# Patient Record
Sex: Female | Born: 1970 | Race: White | Hispanic: Yes | Marital: Married | Smoking: Never smoker
Health system: Southern US, Community
[De-identification: ages and names within clinical notes are randomized; demographics above are authoritative.]

## PROBLEM LIST (undated history)

## (undated) DIAGNOSIS — E785 Hyperlipidemia, unspecified: Secondary | ICD-10-CM

## (undated) DIAGNOSIS — N8003 Adenomyosis of the uterus: Secondary | ICD-10-CM

## (undated) DIAGNOSIS — I82409 Acute embolism and thrombosis of unspecified deep veins of unspecified lower extremity: Secondary | ICD-10-CM

## (undated) DIAGNOSIS — G5600 Carpal tunnel syndrome, unspecified upper limb: Secondary | ICD-10-CM

## (undated) DIAGNOSIS — I493 Ventricular premature depolarization: Secondary | ICD-10-CM

## (undated) DIAGNOSIS — L2089 Other atopic dermatitis: Secondary | ICD-10-CM

## (undated) DIAGNOSIS — M792 Neuralgia and neuritis, unspecified: Secondary | ICD-10-CM

## (undated) DIAGNOSIS — M1990 Unspecified osteoarthritis, unspecified site: Secondary | ICD-10-CM

## (undated) DIAGNOSIS — Z1509 Genetic susceptibility to other malignant neoplasm: Secondary | ICD-10-CM

## (undated) DIAGNOSIS — K219 Gastro-esophageal reflux disease without esophagitis: Secondary | ICD-10-CM

## (undated) DIAGNOSIS — I890 Lymphedema, not elsewhere classified: Secondary | ICD-10-CM

## (undated) DIAGNOSIS — Z1501 Genetic susceptibility to malignant neoplasm of breast: Secondary | ICD-10-CM

## (undated) DIAGNOSIS — N800 Endometriosis of uterus: Secondary | ICD-10-CM

## (undated) DIAGNOSIS — T7840XA Allergy, unspecified, initial encounter: Secondary | ICD-10-CM

## (undated) DIAGNOSIS — Z9889 Other specified postprocedural states: Secondary | ICD-10-CM

## (undated) DIAGNOSIS — N809 Endometriosis, unspecified: Secondary | ICD-10-CM

## (undated) HISTORY — DX: Genetic susceptibility to malignant neoplasm of breast: Z15.09

## (undated) HISTORY — DX: Unspecified osteoarthritis, unspecified site: M19.90

## (undated) HISTORY — DX: Endometriosis, unspecified: N80.9

## (undated) HISTORY — DX: Neuralgia and neuritis, unspecified: M79.2

## (undated) HISTORY — DX: Gastro-esophageal reflux disease without esophagitis: K21.9

## (undated) HISTORY — DX: Other specified postprocedural states: Z98.890

## (undated) HISTORY — DX: Lymphedema, not elsewhere classified: I89.0

## (undated) HISTORY — DX: Other atopic dermatitis: L20.89

## (undated) HISTORY — DX: Acute embolism and thrombosis of unspecified deep veins of unspecified lower extremity: I82.409

## (undated) HISTORY — DX: Carpal tunnel syndrome, unspecified upper limb: G56.00

## (undated) HISTORY — DX: Endometriosis of uterus: N80.0

## (undated) HISTORY — DX: Hyperlipidemia, unspecified: E78.5

## (undated) HISTORY — DX: Adenomyosis of the uterus: N80.03

## (undated) HISTORY — DX: Genetic susceptibility to malignant neoplasm of breast: Z15.01

## (undated) HISTORY — DX: Allergy, unspecified, initial encounter: T78.40XA

## (undated) HISTORY — DX: Ventricular premature depolarization: I49.3

---

## 2000-11-06 ENCOUNTER — Inpatient Hospital Stay (HOSPITAL_COMMUNITY): Admission: AD | Admit: 2000-11-06 | Discharge: 2000-11-06 | Payer: Self-pay | Admitting: Obstetrics

## 2000-12-05 ENCOUNTER — Encounter: Admission: RE | Admit: 2000-12-05 | Discharge: 2000-12-05 | Payer: Self-pay | Admitting: Family Medicine

## 2000-12-25 ENCOUNTER — Encounter: Admission: RE | Admit: 2000-12-25 | Discharge: 2000-12-25 | Payer: Self-pay | Admitting: Family Medicine

## 2000-12-30 ENCOUNTER — Encounter: Admission: RE | Admit: 2000-12-30 | Discharge: 2000-12-30 | Payer: Self-pay | Admitting: Family Medicine

## 2001-01-01 ENCOUNTER — Ambulatory Visit (HOSPITAL_COMMUNITY): Admission: RE | Admit: 2001-01-01 | Discharge: 2001-01-01 | Payer: Self-pay

## 2001-01-22 ENCOUNTER — Encounter: Admission: RE | Admit: 2001-01-22 | Discharge: 2001-01-22 | Payer: Self-pay | Admitting: Family Medicine

## 2001-02-13 ENCOUNTER — Encounter: Admission: RE | Admit: 2001-02-13 | Discharge: 2001-02-13 | Payer: Self-pay | Admitting: Family Medicine

## 2001-02-17 ENCOUNTER — Encounter: Admission: RE | Admit: 2001-02-17 | Discharge: 2001-02-17 | Payer: Self-pay | Admitting: Family Medicine

## 2001-02-25 ENCOUNTER — Encounter: Admission: RE | Admit: 2001-02-25 | Discharge: 2001-02-25 | Payer: Self-pay | Admitting: Family Medicine

## 2001-03-05 ENCOUNTER — Encounter: Admission: RE | Admit: 2001-03-05 | Discharge: 2001-03-05 | Payer: Self-pay | Admitting: Family Medicine

## 2001-03-12 ENCOUNTER — Encounter: Admission: RE | Admit: 2001-03-12 | Discharge: 2001-03-12 | Payer: Self-pay | Admitting: Family Medicine

## 2001-03-17 ENCOUNTER — Encounter (HOSPITAL_COMMUNITY): Admission: RE | Admit: 2001-03-17 | Discharge: 2001-03-17 | Payer: Self-pay | Admitting: Obstetrics & Gynecology

## 2001-03-18 ENCOUNTER — Encounter: Admission: RE | Admit: 2001-03-18 | Discharge: 2001-03-18 | Payer: Self-pay | Admitting: Family Medicine

## 2001-03-19 ENCOUNTER — Encounter (INDEPENDENT_AMBULATORY_CARE_PROVIDER_SITE_OTHER): Payer: Self-pay

## 2001-03-19 ENCOUNTER — Inpatient Hospital Stay (HOSPITAL_COMMUNITY): Admission: AD | Admit: 2001-03-19 | Discharge: 2001-03-23 | Payer: Self-pay | Admitting: *Deleted

## 2001-03-24 ENCOUNTER — Encounter: Admission: RE | Admit: 2001-03-24 | Discharge: 2001-04-23 | Payer: Self-pay | Admitting: *Deleted

## 2001-03-28 ENCOUNTER — Encounter: Admission: RE | Admit: 2001-03-28 | Discharge: 2001-03-28 | Payer: Self-pay | Admitting: Sports Medicine

## 2001-03-29 ENCOUNTER — Inpatient Hospital Stay (HOSPITAL_COMMUNITY): Admission: AD | Admit: 2001-03-29 | Discharge: 2001-03-29 | Payer: Self-pay | Admitting: *Deleted

## 2001-04-01 ENCOUNTER — Encounter: Admission: RE | Admit: 2001-04-01 | Discharge: 2001-04-01 | Payer: Self-pay | Admitting: Family Medicine

## 2001-05-01 ENCOUNTER — Encounter: Admission: RE | Admit: 2001-05-01 | Discharge: 2001-05-01 | Payer: Self-pay | Admitting: Family Medicine

## 2001-08-04 ENCOUNTER — Encounter: Admission: RE | Admit: 2001-08-04 | Discharge: 2001-08-04 | Payer: Self-pay | Admitting: Family Medicine

## 2001-11-21 ENCOUNTER — Encounter: Admission: RE | Admit: 2001-11-21 | Discharge: 2001-11-21 | Payer: Self-pay | Admitting: Family Medicine

## 2001-11-27 ENCOUNTER — Encounter: Admission: RE | Admit: 2001-11-27 | Discharge: 2001-11-27 | Payer: Self-pay | Admitting: Family Medicine

## 2002-06-23 ENCOUNTER — Encounter: Admission: RE | Admit: 2002-06-23 | Discharge: 2002-06-23 | Payer: Self-pay | Admitting: Family Medicine

## 2002-06-29 ENCOUNTER — Encounter: Payer: Self-pay | Admitting: Family Medicine

## 2002-06-29 ENCOUNTER — Encounter: Admission: RE | Admit: 2002-06-29 | Discharge: 2002-06-29 | Payer: Self-pay | Admitting: Family Medicine

## 2002-07-07 ENCOUNTER — Encounter: Admission: RE | Admit: 2002-07-07 | Discharge: 2002-07-07 | Payer: Self-pay | Admitting: Family Medicine

## 2002-11-17 ENCOUNTER — Encounter: Admission: RE | Admit: 2002-11-17 | Discharge: 2002-11-17 | Payer: Self-pay | Admitting: Sports Medicine

## 2003-05-18 ENCOUNTER — Encounter: Admission: RE | Admit: 2003-05-18 | Discharge: 2003-05-18 | Payer: Self-pay | Admitting: Family Medicine

## 2003-08-09 ENCOUNTER — Encounter: Admission: RE | Admit: 2003-08-09 | Discharge: 2003-08-09 | Payer: Self-pay | Admitting: Family Medicine

## 2003-08-17 ENCOUNTER — Encounter: Admission: RE | Admit: 2003-08-17 | Discharge: 2003-08-17 | Payer: Self-pay | Admitting: Sports Medicine

## 2003-10-14 ENCOUNTER — Encounter: Admission: RE | Admit: 2003-10-14 | Discharge: 2003-10-14 | Payer: Self-pay | Admitting: Sports Medicine

## 2003-10-18 ENCOUNTER — Encounter: Admission: RE | Admit: 2003-10-18 | Discharge: 2003-10-18 | Payer: Self-pay | Admitting: Family Medicine

## 2003-10-21 ENCOUNTER — Encounter: Admission: RE | Admit: 2003-10-21 | Discharge: 2003-10-21 | Payer: Self-pay | Admitting: Sports Medicine

## 2003-10-26 ENCOUNTER — Encounter: Admission: RE | Admit: 2003-10-26 | Discharge: 2003-10-26 | Payer: Self-pay | Admitting: Family Medicine

## 2004-04-17 ENCOUNTER — Ambulatory Visit: Payer: Self-pay | Admitting: Family Medicine

## 2004-05-16 ENCOUNTER — Ambulatory Visit: Payer: Self-pay | Admitting: Sports Medicine

## 2004-07-30 ENCOUNTER — Encounter (INDEPENDENT_AMBULATORY_CARE_PROVIDER_SITE_OTHER): Payer: Self-pay | Admitting: *Deleted

## 2004-08-11 ENCOUNTER — Ambulatory Visit: Payer: Self-pay | Admitting: Sports Medicine

## 2004-08-16 ENCOUNTER — Ambulatory Visit (HOSPITAL_COMMUNITY): Admission: RE | Admit: 2004-08-16 | Discharge: 2004-08-16 | Payer: Self-pay | Admitting: Family Medicine

## 2004-08-24 ENCOUNTER — Encounter: Admission: RE | Admit: 2004-08-24 | Discharge: 2004-08-24 | Payer: Self-pay | Admitting: Family Medicine

## 2004-09-12 ENCOUNTER — Ambulatory Visit: Payer: Self-pay | Admitting: Family Medicine

## 2004-10-19 ENCOUNTER — Ambulatory Visit: Payer: Self-pay | Admitting: Sports Medicine

## 2004-11-30 ENCOUNTER — Ambulatory Visit: Payer: Self-pay | Admitting: Family Medicine

## 2005-06-15 ENCOUNTER — Ambulatory Visit: Payer: Self-pay | Admitting: Family Medicine

## 2005-07-04 ENCOUNTER — Ambulatory Visit: Payer: Self-pay | Admitting: Family Medicine

## 2005-08-28 ENCOUNTER — Encounter: Admission: RE | Admit: 2005-08-28 | Discharge: 2005-08-28 | Payer: Self-pay | Admitting: Sports Medicine

## 2005-09-19 ENCOUNTER — Encounter: Admission: RE | Admit: 2005-09-19 | Discharge: 2005-09-19 | Payer: Self-pay | Admitting: Sports Medicine

## 2005-10-18 ENCOUNTER — Ambulatory Visit: Payer: Self-pay | Admitting: Family Medicine

## 2005-10-18 ENCOUNTER — Other Ambulatory Visit: Admission: RE | Admit: 2005-10-18 | Discharge: 2005-10-18 | Payer: Self-pay | Admitting: Family Medicine

## 2005-11-02 ENCOUNTER — Encounter: Admission: RE | Admit: 2005-11-02 | Discharge: 2005-11-02 | Payer: Self-pay | Admitting: *Deleted

## 2006-02-12 ENCOUNTER — Encounter (INDEPENDENT_AMBULATORY_CARE_PROVIDER_SITE_OTHER): Payer: Self-pay | Admitting: *Deleted

## 2006-02-12 ENCOUNTER — Inpatient Hospital Stay (HOSPITAL_COMMUNITY): Admission: RE | Admit: 2006-02-12 | Discharge: 2006-02-14 | Payer: Self-pay | Admitting: Obstetrics & Gynecology

## 2006-02-12 HISTORY — PX: ABDOMINAL HYSTERECTOMY: SHX81

## 2006-03-29 ENCOUNTER — Encounter: Admission: RE | Admit: 2006-03-29 | Discharge: 2006-03-29 | Payer: Self-pay | Admitting: Obstetrics & Gynecology

## 2006-04-12 ENCOUNTER — Ambulatory Visit: Payer: Self-pay | Admitting: Family Medicine

## 2006-06-03 ENCOUNTER — Ambulatory Visit: Payer: Self-pay | Admitting: Sports Medicine

## 2006-08-30 ENCOUNTER — Ambulatory Visit: Payer: Self-pay | Admitting: *Deleted

## 2006-09-02 ENCOUNTER — Encounter: Admission: RE | Admit: 2006-09-02 | Discharge: 2006-09-02 | Payer: Self-pay | Admitting: Obstetrics & Gynecology

## 2006-09-02 ENCOUNTER — Ambulatory Visit: Payer: Self-pay | Admitting: Family Medicine

## 2006-09-26 DIAGNOSIS — J45909 Unspecified asthma, uncomplicated: Secondary | ICD-10-CM | POA: Insufficient documentation

## 2006-09-26 DIAGNOSIS — L2089 Other atopic dermatitis: Secondary | ICD-10-CM

## 2006-09-26 HISTORY — DX: Other atopic dermatitis: L20.89

## 2006-09-27 ENCOUNTER — Encounter (INDEPENDENT_AMBULATORY_CARE_PROVIDER_SITE_OTHER): Payer: Self-pay | Admitting: *Deleted

## 2006-10-02 ENCOUNTER — Telehealth: Payer: Self-pay | Admitting: *Deleted

## 2006-10-03 ENCOUNTER — Ambulatory Visit: Payer: Self-pay

## 2007-04-14 ENCOUNTER — Encounter: Admission: RE | Admit: 2007-04-14 | Discharge: 2007-04-14 | Payer: Self-pay | Admitting: Obstetrics & Gynecology

## 2007-05-07 ENCOUNTER — Ambulatory Visit: Payer: Self-pay | Admitting: Family Medicine

## 2007-05-16 ENCOUNTER — Telehealth (INDEPENDENT_AMBULATORY_CARE_PROVIDER_SITE_OTHER): Payer: Self-pay | Admitting: *Deleted

## 2007-05-16 ENCOUNTER — Ambulatory Visit: Payer: Self-pay | Admitting: Internal Medicine

## 2007-05-20 ENCOUNTER — Telehealth (INDEPENDENT_AMBULATORY_CARE_PROVIDER_SITE_OTHER): Payer: Self-pay | Admitting: *Deleted

## 2007-05-20 ENCOUNTER — Ambulatory Visit: Payer: Self-pay | Admitting: Family Medicine

## 2007-05-23 ENCOUNTER — Ambulatory Visit: Payer: Self-pay | Admitting: Family Medicine

## 2007-06-09 ENCOUNTER — Ambulatory Visit: Payer: Self-pay | Admitting: Family Medicine

## 2007-07-18 HISTORY — PX: TYMPANOMASTOIDECTOMY: SHX34

## 2007-08-25 ENCOUNTER — Other Ambulatory Visit: Admission: RE | Admit: 2007-08-25 | Discharge: 2007-08-25 | Payer: Self-pay | Admitting: Obstetrics & Gynecology

## 2007-09-09 ENCOUNTER — Encounter: Admission: RE | Admit: 2007-09-09 | Discharge: 2007-09-09 | Payer: Self-pay | Admitting: Obstetrics & Gynecology

## 2008-03-25 ENCOUNTER — Ambulatory Visit: Payer: Self-pay | Admitting: Family Medicine

## 2008-03-25 ENCOUNTER — Telehealth: Payer: Self-pay | Admitting: *Deleted

## 2008-04-01 ENCOUNTER — Ambulatory Visit (HOSPITAL_COMMUNITY): Admission: RE | Admit: 2008-04-01 | Discharge: 2008-04-01 | Payer: Self-pay | Admitting: Family Medicine

## 2008-04-01 ENCOUNTER — Encounter: Payer: Self-pay | Admitting: Family Medicine

## 2008-04-01 ENCOUNTER — Ambulatory Visit: Payer: Self-pay | Admitting: Family Medicine

## 2008-04-01 LAB — CONVERTED CEMR LAB
Alkaline Phosphatase: 66 units/L (ref 39–117)
BUN: 10 mg/dL (ref 6–23)
Glucose, Bld: 94 mg/dL (ref 70–99)
Sodium: 140 meq/L (ref 135–145)
Total Bilirubin: 0.3 mg/dL (ref 0.3–1.2)

## 2008-04-02 ENCOUNTER — Encounter: Payer: Self-pay | Admitting: Family Medicine

## 2008-04-02 ENCOUNTER — Telehealth (INDEPENDENT_AMBULATORY_CARE_PROVIDER_SITE_OTHER): Payer: Self-pay | Admitting: *Deleted

## 2008-04-06 ENCOUNTER — Encounter: Payer: Self-pay | Admitting: Family Medicine

## 2008-04-06 ENCOUNTER — Ambulatory Visit: Payer: Self-pay | Admitting: Family Medicine

## 2008-04-07 ENCOUNTER — Encounter: Payer: Self-pay | Admitting: Family Medicine

## 2008-04-12 ENCOUNTER — Encounter: Payer: Self-pay | Admitting: Family Medicine

## 2008-04-20 ENCOUNTER — Ambulatory Visit: Payer: Self-pay | Admitting: Family Medicine

## 2008-05-20 ENCOUNTER — Ambulatory Visit: Payer: Self-pay | Admitting: Family Medicine

## 2008-05-20 ENCOUNTER — Encounter: Payer: Self-pay | Admitting: Family Medicine

## 2008-05-21 ENCOUNTER — Encounter: Payer: Self-pay | Admitting: Family Medicine

## 2008-05-25 LAB — CONVERTED CEMR LAB
Cholesterol: 217 mg/dL — ABNORMAL HIGH (ref 0–200)
HCT: 40.2 % (ref 36.0–46.0)
HDL: 54 mg/dL (ref >39)
Hemoglobin: 13.4 g/dL (ref 12.0–15.0)
LDL Cholesterol: 138 mg/dL — ABNORMAL HIGH (ref 0–99)
MCHC: 33.3 g/dL (ref 30.0–36.0)
MCV: 87.4 fL (ref 78.0–100.0)
Platelets: 317 10*3/uL (ref 150–400)
RBC: 4.6 M/uL (ref 3.87–5.11)
RDW: 13.1 % (ref 11.5–15.5)
TSH: 2.272 microintl units/mL (ref 0.350–4.50)
Total CHOL/HDL Ratio: 4
Triglycerides: 127 mg/dL (ref <150)
VLDL: 25 mg/dL (ref 0–40)
WBC: 7.2 10*3/uL (ref 4.0–10.5)

## 2008-09-20 ENCOUNTER — Encounter: Admission: RE | Admit: 2008-09-20 | Discharge: 2008-09-20 | Payer: Self-pay | Admitting: Obstetrics & Gynecology

## 2009-06-10 ENCOUNTER — Ambulatory Visit: Payer: Self-pay | Admitting: Family Medicine

## 2009-07-30 HISTORY — PX: OOPHORECTOMY: SHX86

## 2009-10-10 ENCOUNTER — Encounter: Admission: RE | Admit: 2009-10-10 | Discharge: 2009-10-10 | Payer: Self-pay | Admitting: Obstetrics & Gynecology

## 2009-10-24 ENCOUNTER — Encounter: Admission: RE | Admit: 2009-10-24 | Discharge: 2009-10-24 | Payer: Self-pay | Admitting: Obstetrics & Gynecology

## 2009-10-26 ENCOUNTER — Encounter: Admission: RE | Admit: 2009-10-26 | Discharge: 2009-10-26 | Payer: Self-pay | Admitting: Obstetrics & Gynecology

## 2009-10-28 DIAGNOSIS — Z9889 Other specified postprocedural states: Secondary | ICD-10-CM

## 2009-10-28 HISTORY — DX: Other specified postprocedural states: Z98.890

## 2009-11-02 ENCOUNTER — Encounter: Admission: RE | Admit: 2009-11-02 | Discharge: 2009-11-02 | Payer: Self-pay | Admitting: Obstetrics & Gynecology

## 2009-11-16 ENCOUNTER — Ambulatory Visit: Payer: Self-pay | Admitting: Oncology

## 2010-04-20 ENCOUNTER — Ambulatory Visit: Payer: Self-pay | Admitting: Oncology

## 2010-05-14 ENCOUNTER — Encounter: Payer: Self-pay | Admitting: Family Medicine

## 2010-05-18 ENCOUNTER — Ambulatory Visit: Payer: Self-pay | Admitting: Family Medicine

## 2010-06-08 ENCOUNTER — Encounter: Admission: RE | Admit: 2010-06-08 | Discharge: 2010-06-08 | Payer: Self-pay | Admitting: Obstetrics & Gynecology

## 2010-06-29 HISTORY — PX: LAPAROSCOPY: SHX197

## 2010-07-11 ENCOUNTER — Telehealth: Payer: Self-pay | Admitting: Family Medicine

## 2010-07-25 ENCOUNTER — Ambulatory Visit (HOSPITAL_COMMUNITY)
Admission: RE | Admit: 2010-07-25 | Discharge: 2010-07-25 | Payer: Self-pay | Source: Home / Self Care | Attending: Obstetrics & Gynecology | Admitting: Obstetrics & Gynecology

## 2010-07-30 HISTORY — PX: MASTECTOMY: SHX3

## 2010-08-20 ENCOUNTER — Encounter: Payer: Self-pay | Admitting: Obstetrics & Gynecology

## 2010-08-29 NOTE — Consult Note (Signed)
[facility]   Imported By: Bradly Bienenstock 05/28/2008 16:59:47  _____________________________________________________________________  External Attachment:    Type:   Image     Comment:   External Document  Appended Document: Orders Update    Clinical Lists Changes  Medications: Added new medication of TOPAMAX 25 MG TABS (TOPIRAMATE) take one by mouth each night for migraine prophylaxis - Signed Rx of TOPAMAX 25 MG TABS (TOPIRAMATE) take one by mouth each night for migraine prophylaxis;  #90 x 0;  Signed;  Entered by: Helane Rima MD;  Authorized by: Helane Rima MD;  Method used: Historical    Prescriptions: TOPAMAX 25 MG TABS (TOPIRAMATE) take one by mouth each night for migraine prophylaxis  #90 x 0   Entered and Authorized by:   Helane Rima MD   Signed by:   Helane Rima MD on 05/30/2008   Method used:   Historical   RxID:   [id number]

## 2010-08-29 NOTE — Assessment & Plan Note (Signed)
Summary: flu shot/wallace/bmc  Nurse Visit   Vital Signs:  Patient profile:   40 year old female Temp:     98.4 degrees F  Vitals Entered By: Theresia Lo RN (May 18, 2010 9:30 AM)  Allergies: 1)  ! Codeine  Immunizations Administered:  Influenza Vaccine # 1:    Vaccine Type: Fluvax 3+    Site: right deltoid    Mfr: GlaxoSmithKline    Dose: 0.5 ml    Route: IM    Given by: Theresia Lo RN    Exp. Date: 01/24/2011    Lot #: [id number]    VIS given: 02/21/10 version given May 18, 2010.  Flu Vaccine Consent Questions:    Do you have a history of severe allergic reactions to this vaccine? no    Any prior history of allergic reactions to egg and/or gelatin? no    Do you have a sensitivity to the preservative Thimersol? no    Do you have a past history of Guillan-Barre Syndrome? no    Do you currently have an acute febrile illness? no    Have you ever had a severe reaction to latex? no    Vaccine information given and explained to patient? yes    Are you currently pregnant? no  Orders Added: 1)  Flu Vaccine 39yrs + [90658] 2)  Admin 1st Vaccine [09811]

## 2010-08-29 NOTE — Assessment & Plan Note (Signed)
Summary: FU ASTHMA/KH   Vital Signs:  Patient Profile:   40 Years Old Female Weight:      142 pounds Pulse rate:   82 / minute BP sitting:   121 / 72  Vitals Entered By: Lillia Pauls CMA (April 20, 2008 2:18 PM)                 PCP:  Angeline Slim MD  Chief Complaint:  FU ASTHMA AND MED RF.  History of Present Illness: Sylvia Hernandez is a 40 yo female that has had a thorough work-up for a history of atypical chest pain of unknown etiology. She has had normal blood work, normal BNP, negative CXR, normal ECHO, normal EKG. She states that her pain and cough associated with the chest pain have improved since her last visit. At that time, she was given an Rx for Omeprazole by Dr. Corky Downs for suspected dyspepsia with reflux. She was also thought to have bronchitis, which has resolved completely.   She recently underwent a treadmill test:  Results of Graded Exercise Summary from Orpah Cobb, MD, reviewed. 04/12/2008 Max Workload:  8.4 mets 7:00 total exercise time (terminated for max HR attained) Max HR:  174 (94% of max predicted 184) Max BP:  140/78 Some chest pain with breathing only.  No dysrhythmia. Delayed ST depression in infero-lateral leads (1 mm). EKG and chest pain equivocal.  She stated that she "had a cold" at the time of the treadmill test and that her cardiologist wants to repeat the test October 14th. He advised her to take a daily ASA until then.   Jackie does still complain of a feeling of chest pressure, dread, and palpitations each night when she lies down. She states that she is not anxious and does not have increased stress at home.  Asthma History:  Asthma Severity Index Determination:      Asthma severity index questions reveal daytime symptoms fewer than 3 times per week and fewer than 3 nighttime symptoms per month.  Goals of Therapy:      The patient notes minimal or no chronic symptoms day or night, minimal or no exacerbations, no limitations on  activity, no school/parent's missing work, minimal use of short-acting inhaled B2 agonists, and minimal or no adverse effects from medications.    Immunizations:      Last Flu shot: Fluvax 3+ (05/07/2007)  Dyspepsia History:      There is a prior history of GERD.  The patient does not have a prior history of documented ulcer disease.  The dominant symptom is heartburn or acid reflux.  No previous upper endoscopy has been done.       Mammogram Sep 09, 2007 normal     Updated Prior Medication List: FLONASE 50 MCG/ACT  SUSP (FLUTICASONE PROPIONATE) Two sprays in each nostril daily OMEPRAZOLE 40 MG CPDR (OMEPRAZOLE) 1 tab by mouth daily ASPIRIN 81 MG  TBEC (ASPIRIN) one by mouth every day  Current Allergies: ! 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       Migraines: see Neurologist, Dr Sharene Skeans       Asthma since childhood, but requires only 1 use of albuterol yearly  Past Surgical History:    Reviewed history from 09/26/2006 and no  changes required:       Breast MRI Normal - 11/09/2005, C Section--failure to progress - 03/30/2001, Pelvic U/S-nl - 08/24/2004   Family History:    Reviewed history from 09/26/2006 and no changes required:       2 first cousins with Breast Ca., Brother has DM and hypothyroidism, and HTN, Father with Bone CA, Grandmother with hypothryoidism, Sister with BRCA age 25 died age 1, Uncle with PrCA  Social History:    Reviewed history from 09/26/2006 and no changes required:       No tobacco or etoh, husband Danielson, both from [place] Daughter is [place].   Risk Factors:  Passive smoke exposure:  no Drug use:  no HIV high-risk behavior:  no Alcohol use:  no  Mammogram History:     Date of Last Mammogram:  09/09/2007    Results:  normal   Mammogram History:     Date of Last Mammogram:  07/30/2005   PAP  Smear History:     Date of Last PAP Smear:  07/30/2004    Review of Systems       The patient complains of chest pain, abdominal pain, and severe indigestion/heartburn.  The patient denies weight loss, weight gain, vision loss, syncope, peripheral edema, prolonged cough, hematuria, muscle weakness, and depression.     Physical Exam  General:     alert, well-developed, well-nourished, and well-hydrated.   Lungs:     normal respiratory effort, no dullness, no fremitus, no crackles, and no wheezes.   Heart:     normal rate, regular rhythm, no murmur, no gallop, and no rub.   Abdomen:     soft, normal bowel sounds, no distention, and no rigidity, mild tenderness to palpation epigastric.   Extremities:     no clubbing, cyanosis, edema, or deformity noted with normal full range of motion of all joints.   Psych:     cognition and judgment appear intact. Alert and cooperative with normal attention span and concentration.     Impression & Recommendations:  Problem # 1:  CHEST PAIN (ICD-786.50) Atypical, most likely etiology is dyspepsia/ GERD. Continue the omeprazole daily.  Continue ASA daily. Follow up with cardiologist on May 12, 2008 for treadmill test. Folow up with PCP in one month or sooner to re-evaluate chest pain.   Problem # 2:  GASTROESOPHAGEAL REFLUX, NO ESOPHAGITIS (ICD-530.81)  Her updated medication list for this problem includes:    Omeprazole 40 Mg Cpdr (Omeprazole) .Marland Kitchen... 1 tab by mouth daily  Orders: [facility]- Est Level  3 (04540)   Problem # 3:  ASTHMA, UNSPECIFIED (ICD-493.90) Cough resolved. Continue albuterol as needed. Orders: [facility]- Est Level  3 (98119)  Her updated medication list for this problem includes:    Ventolin Hfa 108 (90 Base) Mcg/act Aers (Albuterol sulfate) .Marland Kitchen... 2 puffs every 4 hours as needed for wheezing/ shortness of breath   Complete Medication List: 1)  Flonase 50 Mcg/act Susp (Fluticasone propionate) .... Two sprays in each nostril  daily 2)  Omeprazole 40 Mg Cpdr (Omeprazole) .Marland Kitchen.. 1 tab by mouth daily 3)  Aspirin 81 Mg Tbec (Aspirin) .... One by mouth every day 4)  Ventolin Hfa 108 (90 Base) Mcg/act Aers (Albuterol sulfate) .... 2 puffs every 4 hours as needed for wheezing/ shortness of breath  Asthma Management Plan:  Updated Asthma Medications:    Ventolin Hfa 108 (90 Base) Mcg/act Aers (Albuterol sulfate) .Marland Kitchen... 2 puffs every 4 hours as needed for wheezing/ shortness of breath  Patient Instructions: 1)  Please schedule a follow-up appointment in 1 month. 2)  Continue Prilosec and ASA.   Prescriptions: VENTOLIN HFA 108 (90 BASE) MCG/ACT AERS (ALBUTEROL SULFATE) 2 puffs every 4 hours as needed for wheezing/ shortness of breath  #1 x 2   Entered and Authorized by:   Helane Rima MD   Signed by:   Helane Rima MD on 04/22/2008   Method used:   Historical   RxID:   [id number] ASPIRIN 81 MG  TBEC (ASPIRIN) one by mouth every day  #30 x 0   Entered and Authorized by:   Helane Rima MD   Signed by:   Helane Rima MD on 04/22/2008   Method used:   Historical   RxID:   [id number]  ]

## 2010-08-29 NOTE — Miscellaneous (Signed)
Summary: Asthma Update - Intermittent     New Problems: ASTHMA, INTERMITTENT (ICD-493.90)   New Problems: ASTHMA, INTERMITTENT (ICD-493.90) 

## 2010-08-29 NOTE — Assessment & Plan Note (Signed)
Summary: FU/KH   Vital Signs:  Patient Profile:   40 Years Old Female Weight:      142.1 pounds Temp:     98.0 degrees F oral Pulse rate:   82 / minute BP sitting:   111 / 73  (left arm) Cuff size:   regular  Pt. in pain?   no  Vitals Entered By: Garen Grams LPN (May 20, 2008 3:33 PM)                  PCP:  Helane Rima MD  Chief Complaint:  f/u visit chest pain.  History of Present Illness: Sylvia Hernandez is a 40 year old woman presenting for follow up to evaluate any recurrence of chest pain, as well as c/o fatigue and migraines.   1. CHEST PAIN: She has had an extensive workup, including EKG, CXR, ECHO that were all negative. She is being followed by cardiology. She recently had a repeat treadmill test (see last visit note for details of first treadmill test). I do not have the results of this second test. Per the patient, the cardiologist told her that the test was "probably fine," but that there were 2 possible concerning leads. She was given an Rx for Metoprolol 25 mg daily and has a follow up appointment on 06/09/08. Today, she denies chest pain, shortness of breath, nausea, vomiting, diarrhea, dizziness and abdominal pain.  2. PALPITATIONS: She states that she has been experiencing less heart palpatations since she started taking the Metoprolol. These still occur at night once she lies down to go to sleep. See previous note for details.  3. GERD: She has been taking Prilosec daily as dyspepsia was the suspected etiology of her chest pain.   4. FATIGUE: She states that has has been tired lately. She is sleeping well.   5. MIGRAINES: She complainns of a moderate to severe migraine today. She will see her neurologist tomorrow to restart her previous Rx of Topamax. This was discontinued when evaluating the etiology of her "heart palpations."  6. ALLERGIC RHINITIS: Needs prescription for Flonase renewed.     Prior Medication List:  FLONASE 50 MCG/ACT  SUSP (FLUTICASONE  PROPIONATE) Two sprays in each nostril daily OMEPRAZOLE 40 MG CPDR (OMEPRAZOLE) 1 tab by mouth daily ASPIRIN 81 MG  TBEC (ASPIRIN) one by mouth every day VENTOLIN HFA 108 (90 BASE) MCG/ACT AERS (ALBUTEROL SULFATE) 2 puffs every 4 hours as needed for wheezing/ shortness of breath   Current Allergies (reviewed today): ! 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 Daughter is [place].    Review of Systems  The patient complains of headaches.  The patient denies fever, weight loss, weight gain, chest pain, syncope, dyspnea on exertion, peripheral edema, prolonged cough, abdominal pain, and severe indigestion/heartburn.     Physical Exam  General:     Well-developed,well-nourished,in no acute distress; alert,appropriate and cooperative throughout examination Nose:     External nasal examination shows no deformity or inflammation. Nasal mucosa are pink and moist without lesions or exudates. Mouth:     Oral mucosa and oropharynx without lesions or exudates.  Teeth in good repair. Neck:     No deformities, masses, or tenderness noted. Lungs:     Normal respiratory effort, chest expands symmetrically. Lungs are clear to auscultation, no crackles or wheezes. Heart:     Normal rate and regular rhythm. S1 and S2 normal  without gallop, murmur, click, rub or other extra sounds. Abdomen:     Bowel sounds positive,abdomen soft and non-tender without masses, organomegaly or hernias noted. Extremities:     No clubbing, cyanosis, edema, or deformity noted with normal full range of motion of all joints.      Impression & Recommendations:  Problem # 1:  CHEST PAIN (ICD-786.50) Assessment: Improved Resolved. Negative work-up with exception of treadmill test. Will request results. Continue Metoprolol 25 mg daily until follow up appointment with cardiology on Jun 09, 2008. Continue ASA daily. Orders: Lipid-[facility] (04540-98119) [facility]- Est  Level 4 (14782)   Problem # 2:  FATIGUE (ICD-780.79) Assessment: Deteriorated Will check TSH, CBC. Discussed possibility of new fatigue caused by beta blocker. Encouraged healthy diet, exercise, good sleep hygiene. Orders: CBC-[facility] (95621) TSH-[facility] ([id number]) [facility]- Est  Level 4 (62952)   Problem # 3:  GASTROESOPHAGEAL REFLUX, NO ESOPHAGITIS (ICD-530.81) Assessment: Improved Continue Omeprazole.  Her updated medication list for this problem includes:    Omeprazole 40 Mg Cpdr (Omeprazole) .Marland Kitchen... 1 tab by mouth daily   Problem # 4:  ALLERGIC RHINITIS, SEASONAL, MILD (ICD-477.9) Assessment: Deteriorated Continue Flonase and Claritin. Her updated medication list for this problem includes:    Flonase 50 Mcg/act Susp (Fluticasone propionate) .Marland Kitchen..Marland Kitchen Two sprays in each nostril daily  Orders: [facility]- Est  Level 4 (84132)   Problem # 5:  MIGRAINE (ICD-346.90) Assessment: Deteriorated Will follow neurology recommendations for migraine treatment and prevention. Requested records. Her updated medication list for this problem includes:    Aspirin 81 Mg Tbec (Aspirin) ..... One by mouth every day   Problem # 6:  VACCINE AGAINST INFLUENZA (ICD-V04.81) Assessment: Unchanged  Orders: Flu Vaccine 55yrs + (44010) Admin 1st Vaccine (27253)   Complete Medication List: 1)  Flonase 50  Mcg/act Susp (Fluticasone propionate) .... Two sprays in each nostril daily 2)  Omeprazole 40 Mg Cpdr (Omeprazole) .Marland Kitchen.. 1 tab by mouth daily 3)  Aspirin 81 Mg Tbec (Aspirin) .... One by mouth every day 4)  Ventolin Hfa 108 (90 Base) Mcg/act Aers (Albuterol sulfate) .... 2 puffs every 4 hours as needed for wheezing/ shortness of breath  Other Orders: Future Orders: H1N1 vaccine (G6440) ... 05/21/2008 Influenza A (H1N1) w/ Phy couseling (H4742) ... 05/21/2008   Patient Instructions: 1)  Please schedule a follow-up appointment in 3 months.   Prescriptions: FLONASE 50 MCG/ACT  SUSP (FLUTICASONE PROPIONATE) Two sprays in each nostril daily  #1 x 11   Entered and Authorized by:   Helane Rima MD   Signed by:   Helane Rima MD on 05/25/2008   Method used:   Electronically to        [facility]  [address] [phone]* (  retail)       [address]       [place], [place]  [address]       Ph: [phone] or [phone]       Fax: [phone]   RxID:   [id number] VENTOLIN HFA 108 (90 BASE) MCG/ACT AERS (ALBUTEROL SULFATE) 2 puffs every 4 hours as needed for wheezing/ shortness of breath  #1 x 2   Entered by:   ASHA BENTON LPN   Authorized by:   Helane Rima MD   Signed by:   Garen Grams LPN on 29/51/8841   Method used:   [facility]        [phone] ([facility]ail)       [address]       [place], [place]  [address]       Ph: [phone] or [phone]       Fax: [phone]   RxID:   [id number]  ]  Influenza Vaccine    Vaccine Type: Fluvax 3+    Site: left deltoid    Mfr: GlaxoSmithKline    Dose: 0.5 ml    Route: IM    Given by: ASHA BENTON LPN    Exp. Date: 01/26/2009    Lot #: [id number]    VIS given: 02/20/07 version given May 20, 2008.  Flu Vaccine Consent Questions    Do you have a history of severe allergic reactions to this vaccine? no    Any prior history of allergic reactions to egg and/or gelatin? no    Do you have a sensitivity to the  preservative Thimersol? no    Do you have a past history of Guillan-Barre Syndrome? no    Do you currently have an acute febrile illness? no    Have you ever had a severe reaction to latex? no    Vaccine information given and explained to patient? yes    Are you currently pregnant? no

## 2010-08-31 NOTE — Progress Notes (Signed)
Summary: UPDATE PROBLEM LIST   

## 2010-09-02 ENCOUNTER — Encounter: Payer: Self-pay | Admitting: *Deleted

## 2010-09-22 ENCOUNTER — Other Ambulatory Visit: Payer: Self-pay | Admitting: Obstetrics & Gynecology

## 2010-09-22 DIAGNOSIS — Z1231 Encounter for screening mammogram for malignant neoplasm of breast: Secondary | ICD-10-CM

## 2010-10-09 LAB — BASIC METABOLIC PANEL
BUN: 13 mg/dL (ref 6–23)
CO2: 26 mEq/L (ref 19–32)
GFR calc non Af Amer: >60 mL/min (ref >60)
Glucose, Bld: 98 mg/dL (ref 70–99)
Potassium: 3.8 mEq/L (ref 3.5–5.1)
Sodium: 136 mEq/L (ref 135–145)

## 2010-10-09 LAB — CBC
HCT: 40.4 % (ref 36.0–46.0)
Hemoglobin: 13.8 g/dL (ref 12.0–15.0)
MCHC: 34.2 g/dL (ref 30.0–36.0)
MCV: 85.6 fL (ref 78.0–100.0)
RDW: 13.3 % (ref 11.5–15.5)
WBC: 5.9 10*3/uL (ref 4.0–10.5)

## 2010-10-12 ENCOUNTER — Ambulatory Visit
Admission: RE | Admit: 2010-10-12 | Discharge: 2010-10-12 | Disposition: A | Discharge status: Home / Self Care | Payer: 59 | Source: Ambulatory Visit | Attending: Obstetrics & Gynecology | Admitting: Obstetrics & Gynecology

## 2010-10-12 DIAGNOSIS — Z1231 Encounter for screening mammogram for malignant neoplasm of breast: Secondary | ICD-10-CM

## 2010-12-15 ENCOUNTER — Ambulatory Visit
Admission: RE | Admit: 2010-12-15 | Discharge: 2010-12-15 | Disposition: A | Discharge status: Home / Self Care | Payer: 59 | Source: Ambulatory Visit | Attending: Family Medicine | Admitting: Family Medicine

## 2010-12-15 ENCOUNTER — Ambulatory Visit (INDEPENDENT_AMBULATORY_CARE_PROVIDER_SITE_OTHER): Payer: 59 | Admitting: Family Medicine

## 2010-12-15 ENCOUNTER — Encounter: Payer: Self-pay | Admitting: Family Medicine

## 2010-12-15 VITALS — BP 112/79 | HR 80 | Temp 98.2°F | Wt 152.0 lb

## 2010-12-15 DIAGNOSIS — M25561 Pain in right knee: Secondary | ICD-10-CM

## 2010-12-15 DIAGNOSIS — M25569 Pain in unspecified knee: Secondary | ICD-10-CM

## 2010-12-15 MED ORDER — IBUPROFEN 600 MG PO TABS: 600.0000 mg | ORAL_TABLET | Freq: Four times a day (QID) | ORAL | Status: AC | PRN

## 2010-12-15 MED ORDER — CALCIUM CARB-CHOLECALCIFEROL 600-500 MG-UNIT PO CAPS: 1.0000 | ORAL_CAPSULE | Freq: Two times a day (BID) | ORAL | Status: DC

## 2010-12-15 NOTE — Op Note (Signed)
[facility]  Patient:    Sylvia Hernandez, Sylvia Hernandez Visit Number: [id number] MRN: [id number]          Service Type: OBS Location: [id number] Attending Physician:  Michaelle Copas Proc. Date: 03/20/01 Adm. Date:  03/19/2001 Disc. Date: 03/23/2001                             Operative Report  PREOPERATIVE DIAGNOSES:       1. Term intrauterine pregnancy.                               2. Arrest of descent.                               3. Meconium-stained amniotic fluid.  POSTOPERATIVE DIAGNOSES:      Persistent occiput posterior.  PROCEDURE:                    Primary low transverse cesarean section via Pfannenstiel.  SURGEON:                      Roseanna Rainbow, M.D.  ANESTHESIA:                   Epidural.  COMPLICATIONS:                None.  ESTIMATED BLOOD LOSS:         800 cc.  FLUIDS AND URINE OUTPUT:      As per anesthesiology.  INDICATIONS:                  The patient is a para 0 at term.  She progressed to full dilatation without descent beyond +1 station.  FINDINGS:                     The infant was in the cephalic presentation. Neonatology was present at delivery.  Normal uterus, tubes and ovaries.  DESCRIPTION OF PROCEDURE:     The patient was taken to the operating room, where epidural anesthetic was found to be adequate.  She was then prepped and draped in the usual sterile fashion in the dorsal supine position with a leftward tilt.  A Pfannenstiel skin incision was then made with a scalpel and carried through to the underlying layer of fascia.  The fascia was incised in the midline and the incision extended laterally with Mayo scissors.  The superior aspect of the fascial incision was then grasped with Kocher clamps, elevated and the underlying rectus muscles dissected out.  Attention was then turned to the inferior aspect of this incision, which was manipulated in a similar fashion.  The rectus muscles were then  separated in the midline.  The parietal peritoneum was identified, tented up and entered sharply with Metzenbaum scissors.  The peritoneal incision was then extended superiorly and inferiorly with good visualization of the bladder.  A bladder blade was then inserted and the vesicouterine peritoneum identified, grasped with pickups and entered sharply with Metzenbaum scissors.  This incision was then extended laterally and a bladder flap created digitally.  The bladder blade was then reinserted and the lower uterine segment incision in a transverse fashion with a scalpel.  The uterine incision was then extended laterally with bandage scissors.  The uterine  incision was then extended bluntly.  The bladder blade was then removed and the infants head delivered atraumatically. The nose and mouth were suctioned.  The cord was clamped and cut.  The infant was handed off to the awaiting neonatologists.  A cord gas was sent.  The placenta was then removed.  the uterus was exteriorized and cleared of all clots and debris.  The uterine incision was repaired with 0 Monocryl in a running lock fashion.  Excellent hemostasis was noted.  The uterus was returned to the abdomen.  The gutters were cleared of all clots.  The fascia was reapproximated with 0 Vicryl in a running fashion.  The skin was closed with staples.  The patient tolerated the procedure well.  Sponge, lap, and needle counts were correct x 2.  The patient was taken to the PACU in stable condition. Attending Physician:  Michaelle Copas DD:  03/21/01 TD:  03/24/01 Job: [id number]

## 2010-12-15 NOTE — Op Note (Signed)
NAMECHRYSTIE, HAGWOOD              ACCOUNT NO.:  [id number]   MEDICAL RECORD NO.:  [id number]          PATIENT TYPE:  INP   LOCATION:  9399                          FACILITY:  [facility]   PHYSICIAN:  M. Leda Quail, MD  DATE OF BIRTH:  September 03, 1970   DATE OF PROCEDURE:  02/12/2006  DATE OF DISCHARGE:                                 OPERATIVE REPORT   PREOPERATIVE DIAGNOSES:  A 40 year old G1, P1, with dysfunctional uterine  bleeding, pelvic pain and right hydrosalpinx noted on ultrasound.  History  of cesarean section with postoperative wound infection that healed by  secondary intention.   POSTOPERATIVE DIAGNOSES:  1. Dysfunctional uterine bleeding.  2. Pelvic pain.  3. Right hematosalpinx.  4. Extensive adhesions of the bladder to the anterior uterine wall and to      the cervix.  5. Approximately 2 cm simple-appearing right ovarian cyst.   OPERATION PERFORMED:  Total abdominal hysterectomy with right salpingo-  oophorectomy.   SURGEON:  M. Leda Quail, MD   ASSISTANT:  Edwena Felty. Romine, M.D.   ANESTHESIA:  General endotracheal.   SPECIMENS:  Uterus, cervix and right fallopian tube to pathology.   ESTIMATED BLOOD LOSS:  100 mL.   URINE OUTPUT:  150 mL of clear urine in the Foley catheter.   FLUIDS REPLACED:  2400 mL of lactated Ringers   COMPLICATIONS:  None.   INDICATIONS FOR PROCEDURE:  Ms. Lull is a pleasant 40 year old G1, P36  Hispanic female who came initially desiring permanent sterilization.  She  also reported to me a history of irregular bleeding.  She has been on birth  control as an attempt to manage this.  Birth control was changed and  ultrasound was obtained.  She had right hydrosalpinx with questionable solid  area in it.  Ovaries appeared normal.  Uterus appeared normal.  There was no  abnormalities in the posterior cul-de-sac.  The patient was initially tried  on different OCPs without much success.  We then discussed proceeding with a  right  salpingectomy and tubal ligation of the left side as well as possible  HTA for treatment of her bleeding.  She desired definitive treatment now she  desires no further childbearing.  She also wanted information about  hysterectomy.  Ultimately this is what she decided on.  She understands that  she will never have children in the future.  She was consented for a right  salpingo-oophorectomy in case the right fallopian tube was densely adherent  to the ovary or if there was endometriosis or other abnormality on the right  side.   DESCRIPTION OF PROCEDURE:  The patient was taken to the operating room.  She  was placed in supine position.  General endotracheal anesthesia was  administered by the anesthesia staff.  It took approximately four attempts  at intubation.  She does have a small rough area on her left front teeth as  a result of a difficult intubation.  The legs were positioned in a frog leg  position.  Abdomen, perineum, inner thighs and vagina were prepped in the  normal sterile fashion and Foley  catheter was inserted under sterile  conditions.  Then she was draped in the normal sterile fashion after the  legs were straightened.  The legs were positioned on a pillow for the entire  procedure.  A knife was then used to incised through the old cesarean  section incision.  The incision was taken down sharply all the way to fascia  layer and the fascia was nicked in the midline.  Cautery was used to obtain  hemostasis.  Fascial incision was extended laterally using curved Mayos and  pick-ups.  The fascia on the right side of the patient was densely adherent  to the muscle and there did appear to be more dense connective tissue on  that side probably due to the postoperative wound infection.   Kocher clamps were applied to the superior aspect of the fascia and the  fascia was dissected off of the rectus muscles with sharp and blunt  dissection.  Cautery was used for hemostasis.  In a  similar fashion, Kocher  clamps were applied in the superior aspect of the fascial incision and the  underlying rectus muscle was dissected off both sharply and with cautery.  Then attempt was made to divide the rectus muscles in the midline, but these  were adherent as well.  Then at the most superior aspect of the division  between the bilateral rectus muscles, hemostats were applied to the  preperitoneal fat and hopefully peritoneum.  This was elevated using a knife  to dissect it sharply.  The peritoneum was entered without difficulty.  There was no bowel adherent to the anterior abdominal wall.  Then the rectus  muscles were carefully separated and then the peritoneum was incised  inferiorly with the tissue being transilluminated before incising to ensure  that there was no injury to the bladder.  Once the peritoneum was opened,  there was a small amount of omental adhesions to the left side of the  incision. There was nothing else in this except omental adhesions to the  peritoneum and these were taken down with cautery to maintain hemostasis.  At this point, the patient's upper abdomen was surveyed.  Liver edge was  smooth. There did not appear to be any adhesions of the liver edge.  The  stomach edge was smooth.  There were no enlarged lymph nodes noted.  Then a  O'Sullivan-O'Connor retractor was placed intra-abdominally.  Bladder blade  was applied inferiorly.  Two moist laparotomy sponges were used to pack the  bowel superiorly. There was a small amount of adhesions of the left  fallopian tube to the left colon and this was dissected sharply and without  difficulty.   At this point long Kelly clamps were placed across the utero-ovarian  ligament bilaterally and the uterus was elevated.  There was definitely  adhesions of the bladder to the anterior aspect of the uterus and to the  cervix. The round ligaments were suture ligated with #0 Vicryl bilaterally. The round ligaments  posterior leaf of the broad ligaments were opened  bilaterally.  This is first on the left side and then on the right side.  The utero-ovarian ligament was isolated and clamped with Heaney clamp.  This  pedicle was then transected and suture ligated first with a free tie of #0  Vicryl and then with a stitch tie.  Again this was performed on each side.  Then anterior leaf of the broad ligament was opened down to the level of the  internal os of  the cervix.  At this point sharp dissection of the bladder  was performed.  The bladder was extremely adherent to the cervix.  Sharp  dissection was used throughout the dissection process and slowly the  pubovesical cervical fascia was dissected.  The bladder was advanced  inferiorly down the cervix.  This took approximately 20 minutes of sharp  dissection.  At this point the uterine arteries were skeletonized  bilaterally.  Curved Heaney clamps were placed across the uterine arteries.  These pedicles were transected, suture ligated first with #0  Vicryl and  slashing the Heaney clamp and again  with an additional  #0 Vicryl stitch.  At this point the bladder appeared to be far down the level of cervix and  therefore the cardinal ligaments were serially clamped, transected and  suture ligated with #0 Vicryl.  Care was taken to note location of the  bladder throughout this portion of the surgery.  The bladder was indeed  advanced below the cervix.  The base of the cervix, curved Heaney clamps  were placed at the corners incorporating the uterosacral ligaments.  These  pedicles were transected and suture ligated with Heaney stitch of #0 Vicryl.  These stitches were left long.  At this point the vaginal mucosa was entered  and the cervix was circumscribed at the very edge of the cervix where the  reflection of the vaginal mucosa begins.  This was done sharply.  Kocher  clamps were used to grasp the vaginal mucosa.  Once the specimen was freed  from  vaginal mucosa, it was handed off to be sent to pathology.  Then the  corners of the vaginal mucosa were suture ligated with #0 Vicryl and  attached to the uterosacral ligament to suspend the corners of vaginal  mucosa to the uterosacral ligament.  The remainder of the vaginal cuff was  closed with four interrupted figure-of-eight sutures of #0 Vicryl.  At this  point pelvis was irrigated. There was a small amount of bleeding of the  bladder.  Because this was on top of the bladder, a superficial figure-of-  eight suture of 2-0 Vicryl on an SH needle was used to obtain hemostasis.  At this point, irrigation of the pelvis was performed again.  The utero-  ovarian pedicles were hemostatic. The round ligaments were hemostatic.  The  vaginal cuff was hemostatic.  Because of extensive dissection of the  bladder, a Gelfoam was placed on the bladder.  The uterosacral ligaments at  this point were tied together.  Then the areas were visualized on additional time and at this point the right hydrosalpinx was noted which was not noted  at beginning of the procedure.  Decision was made to go head and remove the  right fallopian tube.  A Kelly clamp was placed across the fallopian tube  which was excised and free tied.  #2-0 Vicryl was used to make this pedicle  hemostatic.  Then the ovaries were suspended from the edges of the round  ligament.  Again, excellent hemostasis was noted. The pelvic retractor was  removed.  At the beginning of the procedure, care was taken to palpate the  edges of the retractor and to place sterile green towels under the retractor  to hold the retractor off the psoas muscle and any underlying nerves.  The  laparotomy sponges were removed from the pelvis and the bowel and omentum  were placed back in their normal anatomic position.  Then the peritoneum was  closed  with a running stitch of #2-0 Vicryl.  The rectus muscles and  subfascial tissue was then visualized.  Hemostasis  was achieved with  cautery.  On the patient's right side, a tubing for the On-Q pump was placed  subfascially.  The tubing was positioned on the skin with Steri-Strips.  The  fascia was closed starting at the corners and running to the midline with #0  Vicryl.  Next, a second On-Q pump tubing was placed subcutaneously on  patient's left side.  The tubing was secured in place on the skin with Steri-  Strips.  Subcutaneous fat tissue was irrigated.  Hemostasis was achieved  with cautery. Skin was closed with subcuticular stitch of 3-0 Vicryl.  The  incision was cleansed.  Benzoin and Steri-Strips were applied.   Sponge, lap, needle and instruments were all correct times two.  The patient  tolerated the procedure very well and was then taken to recovery room in  stable condition.      Lum Keas, MD  Electronically Signed     MSM/MEDQ  D:  02/12/2006  T:  02/12/2006  Job:  [id number]

## 2010-12-15 NOTE — Patient Instructions (Signed)
Knee Pain, General The knee is the complex joint between your thigh and your lower leg. It is made up of bones, tendons, ligaments, and cartilage. The bones that make up the knee are:  The femur in the thigh.   The tibia and fibula in the lower leg.   The patella or kneecap riding in the groove on the lower femur.  CAUSES Knee pain is a common complaint with many causes.  A few of these causes are:  Injury, such as:   A ruptured ligament or tendon injury.   Torn cartilage.    Medical conditions, such as:   Gout   Arthritis   Infections   Overuse, over training or overdoing a physical activity.  Knee pain can be minor or severe. Knee pain can accompany debilitating injury. Minor knee problems often respond well to self-care measures or get well on their own. More serious injuries may need medical intervention or even surgery. Symptoms The knee is complex. Symptoms of knee problems can vary widely. Some of the problems are:  Pain with movement and weight bearing.  Swelling and tenderness.   Buckling of the knee.   Inability to straighten or extend your knee.   Your knee locks and you cannot straighten it.  Warmth and redness with pain and fever.   Deformity or dislocation of the kneecap.   DIAGNOSIS Determining what is wrong may be very straight forward such as when there is an injury. It can also be challenging because of the complexity of the knee. Tests to make a diagnosis may include:  Your caregiver taking a history and doing a physical exam.   Routine X-rays can be used to rule out other problems. X-rays will not reveal a cartilage tear. Some injuries of the knee can be diagnosed by:   Arthroscopy a surgical technique by which a small video camera is inserted through tiny incisions on the sides of the knee. This procedure is used to examine and repair internal knee joint problems. Tiny instruments can be used during arthroscopy to repair the torn knee cartilage  (meniscus).   Arthrography is a radiology technique. A contrast liquid is directly injected into the knee joint. Internal structures of the knee joint then become visible on X-ray film.   An MRI scan is a non x-ray radiology procedure in which magnetic fields and a computer produce two- or three-dimensional images of the inside of the knee. Cartilage tears are often visible using an MRI scanner. MRI scans have largely replaced arthrography in diagnosing cartilage tears of the knee.   Blood work.   Examination of the fluid that helps to lubricate the knee joint (synovial fluid). This is done by taking a sample out using a needle and a syringe.  treatment The treatment of knee problems depends on the cause. Some of these treatments are:  Depending on the injury, proper casting, splinting, surgery or physical therapy care will be needed.   Give yourself adequate recovery time. Do not overuse your joints. If you begin to get sore during workout routines, back off. Slow down or do fewer repetitions.   For repetitive activities such as cycling or running, maintain your strength and nutrition.   Alternate muscle groups. For example if you are a weight lifter, work the upper body on one day and the lower body the next.   Either tight or weak muscles do not give the proper support for your knee. Tight or weak muscles do not absorb the stress placed   on the knee joint. Keep the muscles surrounding the knee strong.   Take care of mechanical problems.   If you have flat feet, orthotics or special shoes may help. See your caregiver if you need help.   Arch supports, sometimes with wedges on the inner or outer aspect of the heel, can help. These can shift pressure away from the side of the knee most bothered by osteoarthritis.   A brace called an "unloader" brace also may be used to help ease the pressure on the most arthritic side of the knee.   If your caregiver has prescribed crutches, braces,  wraps or ice, use as directed. The acronym for this is PRICE. This means protection, rest, ice, compression and elevation.   Nonsteroidal anti-inflammatory drugs (NSAID's), can help relieve pain. But if taken immediately after an injury, they may actually increase swelling. Take NSAID's with food in your stomach. Stop them if you develop stomach problems. Do not take these if you have a history of ulcers, stomach pain or bleeding from the bowel. Do not take without your caregiver's approval if you have problems with fluid retention, heart failure, or kidney problems.   For ongoing knee problems, physical therapy may be helpful.   Glucosamine and chondroitin are over-the-counter dietary supplements. Both may help relieve the pain of osteoarthritis in the knee. These medicines are different from the usual anti-inflammatory drugs. Glucosamine may decrease the rate of cartilage destruction.   Injections of a corticosteroid drug into your knee joint may help reduce the symptoms of an arthritis flare-up. They may provide pain relief that lasts a few months. You may have to wait a few months between injections. The injections do have a small increased risk of infection, water retention and elevated blood sugar levels.   Hyaluronic acid injected into damaged joints may ease pain and provide lubrication. These injections may work by reducing inflammation. A series of shots may give relief for as long as 6 months.   Topical painkillers. Applying certain ointments to your skin may help relieve the pain and stiffness of osteoarthritis. Ask your pharmacist for suggestions. Many over the-counter products are approved for temporary relief of arthritis pain.   In some countries, doctors often prescribe topical NSAID's for relief of chronic conditions such as arthritis and tendinitis. A review of treatment with NSAID creams found that they worked as well as oral medications but without the serious side effects.    Prevention  Maintain a healthy weight. Extra pounds put more strain on your joints.   Get strong, stay limber. Weak muscles are a common cause of knee injuries. Stretching is important. Include flexibility exercises in your workouts.   Be smart about exercise. If you have osteoarthritis, chronic knee pain or recurring injuries, you may need to change the way you exercise. This does not mean you have to stop being active. If your knees ache after jogging or playing basketball, consider switching to swimming, water aerobics or other low-impact activities, at least for a few days a week. Sometimes limiting high-impact activities will provide relief.   Make sure your shoes fit well. Choose footwear that is right for your sport.   Protect your knees. Use the proper gear for knee-sensitive activities. Use kneepads when playing volleyball or laying carpet. Buckle your seat belt every time you drive. Most shattered kneecaps occur in car accidents.   Rest when you are tired.  SEEK MEDICAL CARE IF: You have knee pain that is continual and does not seem   to be getting better.  Seek IMMEDIATE MEDICAL CARE IF:  Your knee joint feels hot to the touch and you have a high fever. MAKE SURE YOU:   Understand these instructions.   Will watch your condition.   Will get help right away if you are not doing well or get worse.  Document Released: 05/13/2007 Document Re-Released: 10/10/2009 [employer] Patient Information 2011 [employer]. 

## 2010-12-15 NOTE — Discharge Summary (Signed)
[facility]  Patient:    Sylvia Hernandez, Sylvia Hernandez Visit Number: [id number] MRN: [id number]          Service Type: OBS Location: 910A [id number] [id number] Attending Physician:  Michaelle Copas Dictated by:   Kevin Fenton, M.D. Adm. Date:  03/19/2001 Disc. Date: 03/23/2001                             Discharge Summary  DATE OF BIRTH:                09-24-1970.  DISCHARGE MEDICATIONS:        1. Percocet 5/325 one to tabs p.o. q.6h.                                  p.r.n. pain.                               2. Motrin 600 mg one tab every six hours as                                  needed for pain.                               3. Micronor one tab every day at the same time.                               4. Iron sulfate 325 mg one tab twice a day for                                  the next six weeks.                               5. Prenatal vitamin one tab every day while                                  breast-feeding.  DISCHARGE FOLLOWUP:           She is to return to [facility] at six weeks for followup visit.  DISCHARGE INSTRUCTIONS:       The patient was given discharge instructions.  DISCHARGE DIAGNOSIS:          Status post low transverse cesarean section                               at term secondary to arrest of descent.  BRIEF HOSPITAL COURSE:        This is a 40 year old, G1, admitted at 40-3/7 weeks with SROM with meconium-stained fluid. The patient was started on amnioinfusion, given limited vaginal exams, and was given an epidural for pain management. The patient was augmented with Pitocin for ineffective contraction pattern; however, secondary to failure to descend with persistent OP after several hours of pushing, the patient went to the OR for primary low transverse cesarean section without any complications.  The patient had an unremarkable postoperative course and was discharged home on postoperative day #3 with her  discharge instructions. She is Rh positive, rubella immune, and did not require RhoGAM or rubella vaccine. Dictated by:   Kevin Fenton, M.D. Attending Physician:  Michaelle Copas DD:  03/23/01 TD:  03/24/01 Job: [id number]

## 2010-12-15 NOTE — Discharge Summary (Signed)
Sylvia Hernandez, Sylvia Hernandez              ACCOUNT NO.:  [id number]   MEDICAL RECORD NO.:  [id number]          PATIENT TYPE:  INP   LOCATION:  9314                          FACILITY:  [facility]   PHYSICIAN:  M. Leda Quail, MD  DATE OF BIRTH:  29-Dec-1970   DATE OF ADMISSION:  02/12/2006  DATE OF DISCHARGE:  02/14/2006                                 DISCHARGE SUMMARY   ADMITTING DIAGNOSES:  67.  A 40 year old G1, P65 Hispanic female with a one-year history of      dysfunctional uterine bleeding.  2.  Pelvic pain.  3.  Right hydrosalpinx on ultrasound.  4.  History of cesarean section in 2002.  5.  Desires of no further childbearing.   DISCHARGE DIAGNOSES:  15.  A 40 year old G1, P69 Hispanic female with a one-year history of      dysfunctional uterine bleeding.  2.  Pelvic pain.  3.  Right hydrosalpinx on ultrasound.  4.  History of cesarean section in 2002.  5.  Desires of no further childbearing.   HISTORY OF PRESENT ILLNESS:  Sylvia Hernandez is a 40 year old G1, P35 Hispanic  female with one-year history of dysfunctional uterine bleeding and long  history of chronic pelvic pain.  This began after her cesarean section.  She  has been treated with NSAIDs and oral contraceptives which have not greatly  helped either the dysfunctional uterine bleeding or pain.  She initially  came to see me to discuss having a tubal ligation.  Ultrasound was performed  which showed a normal uterus, but a slightly enlarged right ovary with a  probable hydrosalpinx measuring 4 x 5 cm.  She did have a funny-appearing  solid component to it.  We discussed doing more conservative treatments such  as a salpingectomy and left tubal ligation as well as possible ablation as  she had not improved with oral contraceptives but she requested more  definitive management and we discussed hysterectomy.  Full H&P is in the  chart.   HOSPITAL COURSE:  Patient was admitted to same day surgery.  She was taken  to the operating  room where TAH with right salpingectomy was performed.  She  had approximately 100 mL of blood loss.  She made good urine during the  procedure.  From the OR she was taken to the recovery room and after  appropriate time there taken to the 3A unit for postoperative care.  She did  have some issues with pain control immediately after the surgery and a  Dilaudid PCA was ordered.  She does have a codeine allergy.  The patient's  pain ultimately became under good control and her hospital course was  otherwise uneventful.  Evening of postoperative day one she had stable vital  signs.  She did have a quiet abdomen and the dressing on her incision did  have a small amount of bleeding, but was stable.  The morning of  postoperative day one she had a slight hot spot on the right side of her  incision, but had no nausea.  Tmax was 99.4.  Otherwise, vital signs were  stable.  She had made 4200 mL of clear urine.  Her abdomen had good bowel  sounds, was soft and nontender and the incision was clean, dry, and intact.  Her Foley catheter was removed and she was able to void well.  A diet was  advanced to regular diet.  Ultimately, her IV and the PCA were removed and  she began using oral pain medications.  Throughout the remainder of her  hospitalization she only used Motrin.  She was able to ambulate without  difficulty and on the morning of postoperative day two she had passed  flatus, was tolerating p.o.'s well, and voiding without difficulty.  Tmax  was 98.5 for the previous 24 hours, otherwise  vital signs were stable.  Her incision was clean, dry, and intact.  The On-Q  pump that had been placed in the OR was removed without difficulty.  At this  point discharge home was felt appropriate.  Pathology is still pending for  uterus, cervix, and right fallopian tube.      Lum Keas, MD  Electronically Signed     MSM/MEDQ  D:  02/14/2006  T:  02/14/2006  Job:  [id number]

## 2010-12-18 ENCOUNTER — Encounter: Payer: Self-pay | Admitting: Family Medicine

## 2010-12-18 NOTE — Progress Notes (Signed)
  Subjective:    Sylvia Hernandez is a 40 y.o. female who presents with knee pain involving the right knee. Onset was sudden, starting about 2 weeks ago. Inciting event: tripped and fell on knee, carpet floor. Current symptoms include: crepitus sensation, pain located generalized but > posterior and swelling. Pain is aggravated by any weight bearing, going up and down stairs, pivoting, rising after sitting and walking. Patient has had no prior knee problems. Evaluation to date: none. Treatment to date: none.  The following portions of the patient's history were reviewed and updated as appropriate: allergies, current medications, past family history, past medical history, past social history, past surgical history and problem list.   Review of Systems Pertinent items are noted in HPI.   Objective:    BP 112/79  Pulse 80  Temp(Src) 98.2 F (36.8 C) (Oral)  Wt 152 lb (68.947 kg) Right knee: positive exam findings: tenderness noted entire area, posterior and ROM limited to approximately full extension degrees  Left knee:  normal and no effusion, full active range of motion, no joint line tenderness, ligamentous structures intact.   X-ray right knee: no fracture, dislocation, swelling or degenerative changes noted    Assessment:    Right Knee Pain    Plan:    Natural history and expected course discussed. Questions answered. Rest, ice, compression, and elevation (RICE) therapy. NSAIDs per medication orders. Follow up in 4 weeks.

## 2010-12-20 ENCOUNTER — Telehealth: Payer: Self-pay | Admitting: *Deleted

## 2010-12-20 ENCOUNTER — Other Ambulatory Visit: Payer: Self-pay | Admitting: Family Medicine

## 2010-12-20 DIAGNOSIS — M25561 Pain in right knee: Secondary | ICD-10-CM

## 2010-12-20 MED ORDER — CALCIUM CARBONATE-VITAMIN D 500-200 MG-UNIT PO TABS: 1.0000 | ORAL_TABLET | Freq: Every day | ORAL | Status: DC

## 2010-12-20 NOTE — Telephone Encounter (Signed)
Patient returned call and was given message.Sylvia Hernandez  

## 2010-12-20 NOTE — Telephone Encounter (Signed)
Called and left message for patient to return call. X-ray was normal, continue with care discussed in office visit with Dr Earlene Plater. If not improved in 3-4 weeks schedule follow up appointment with Earlene Plater.Busick, Rodena Medin

## 2011-01-11 ENCOUNTER — Encounter (HOSPITAL_COMMUNITY)
Admission: RE | Admit: 2011-01-11 | Discharge: 2011-01-11 | Disposition: A | Discharge status: Home / Self Care | Payer: 59 | Source: Ambulatory Visit | Attending: General Surgery | Admitting: General Surgery

## 2011-01-11 ENCOUNTER — Ambulatory Visit (HOSPITAL_COMMUNITY)
Admission: RE | Admit: 2011-01-11 | Discharge: 2011-01-11 | Disposition: A | Discharge status: Home / Self Care | Payer: 59 | Source: Ambulatory Visit | Attending: General Surgery | Admitting: General Surgery

## 2011-01-11 ENCOUNTER — Other Ambulatory Visit (INDEPENDENT_AMBULATORY_CARE_PROVIDER_SITE_OTHER): Payer: Self-pay | Admitting: General Surgery

## 2011-01-11 DIAGNOSIS — Z01812 Encounter for preprocedural laboratory examination: Secondary | ICD-10-CM | POA: Insufficient documentation

## 2011-01-11 DIAGNOSIS — Z01818 Encounter for other preprocedural examination: Secondary | ICD-10-CM | POA: Insufficient documentation

## 2011-01-11 DIAGNOSIS — C50919 Malignant neoplasm of unspecified site of unspecified female breast: Secondary | ICD-10-CM | POA: Insufficient documentation

## 2011-01-11 DIAGNOSIS — J45909 Unspecified asthma, uncomplicated: Secondary | ICD-10-CM | POA: Insufficient documentation

## 2011-01-11 DIAGNOSIS — Z01810 Encounter for preprocedural cardiovascular examination: Secondary | ICD-10-CM | POA: Insufficient documentation

## 2011-01-11 LAB — DIFFERENTIAL
Basophils Absolute: 0 10*3/uL (ref 0.0–0.1)
Basophils Relative: 0 % (ref 0–1)
Eosinophils Absolute: 0.1 10*3/uL (ref 0.0–0.7)
Eosinophils Relative: 1 % (ref 0–5)
Lymphocytes Relative: 35 % (ref 12–46)
Lymphs Abs: 2.3 10*3/uL (ref 0.7–4.0)
Monocytes Absolute: 0.5 10*3/uL (ref 0.1–1.0)
Monocytes Relative: 8 % (ref 3–12)
Neutro Abs: 3.5 10*3/uL (ref 1.7–7.7)
Neutrophils Relative %: 55 % (ref 43–77)

## 2011-01-11 LAB — COMPREHENSIVE METABOLIC PANEL
ALT: 16 U/L (ref 0–35)
AST: 17 U/L (ref 0–37)
Albumin: 4.3 g/dL (ref 3.5–5.2)
Alkaline Phosphatase: 79 U/L (ref 39–117)
BUN: 13 mg/dL (ref 6–23)
CO2: 29 mEq/L (ref 19–32)
Calcium: 9.2 mg/dL (ref 8.4–10.5)
Chloride: 101 mEq/L (ref 96–112)
Creatinine, Ser: 0.74 mg/dL (ref 0.4–1.2)
GFR calc Af Amer: >60 mL/min (ref >60)
GFR calc non Af Amer: >60 mL/min (ref >60)
Glucose, Bld: 101 mg/dL — ABNORMAL HIGH (ref 70–99)
Potassium: 3.9 mEq/L (ref 3.5–5.1)
Sodium: 137 mEq/L (ref 135–145)
Total Bilirubin: 0.2 mg/dL — ABNORMAL LOW (ref 0.3–1.2)
Total Protein: 7.2 g/dL (ref 6.0–8.3)

## 2011-01-11 LAB — CBC
HCT: 38.7 % (ref 36.0–46.0)
Hemoglobin: 13.6 g/dL (ref 12.0–15.0)
MCH: 29.7 pg (ref 26.0–34.0)
MCHC: 35.1 g/dL (ref 30.0–36.0)
MCV: 84.5 fL (ref 78.0–100.0)
Platelets: 283 10*3/uL (ref 150–400)
RBC: 4.58 MIL/uL (ref 3.87–5.11)
RDW: 13.2 % (ref 11.5–15.5)
WBC: 6.4 10*3/uL (ref 4.0–10.5)

## 2011-01-11 LAB — SURGICAL PCR SCREEN
MRSA, PCR: NEGATIVE
Staphylococcus aureus: NEGATIVE

## 2011-01-15 ENCOUNTER — Other Ambulatory Visit (INDEPENDENT_AMBULATORY_CARE_PROVIDER_SITE_OTHER): Payer: Self-pay | Admitting: General Surgery

## 2011-01-15 ENCOUNTER — Ambulatory Visit (HOSPITAL_COMMUNITY)
Admission: RE | Admit: 2011-01-15 | Discharge: 2011-01-16 | Disposition: A | Discharge status: Home / Self Care | Payer: 59 | Source: Ambulatory Visit | Attending: General Surgery | Admitting: General Surgery

## 2011-01-15 DIAGNOSIS — Z803 Family history of malignant neoplasm of breast: Secondary | ICD-10-CM | POA: Insufficient documentation

## 2011-01-15 DIAGNOSIS — Z01810 Encounter for preprocedural cardiovascular examination: Secondary | ICD-10-CM | POA: Insufficient documentation

## 2011-01-15 DIAGNOSIS — Z01818 Encounter for other preprocedural examination: Secondary | ICD-10-CM | POA: Insufficient documentation

## 2011-01-15 DIAGNOSIS — Z4001 Encounter for prophylactic removal of breast: Secondary | ICD-10-CM | POA: Insufficient documentation

## 2011-01-15 DIAGNOSIS — Z01812 Encounter for preprocedural laboratory examination: Secondary | ICD-10-CM | POA: Insufficient documentation

## 2011-01-20 NOTE — Op Note (Signed)
Sylvia Hernandez, Sylvia Hernandez              ACCOUNT NO.:  [id number]  MEDICAL RECORD NO.:  [id number]  LOCATION:  5118                         FACILITY:  [facility]  PHYSICIAN:  Ollen Gross. Vernell Morgans, M.D. DATE OF BIRTH:  07/01/1971  DATE OF PROCEDURE:  01/15/2011 DATE OF DISCHARGE:                              OPERATIVE REPORT   PREOPERATIVE DIAGNOSIS:  BRCA-2 positivity.  POSTOPERATIVE DIAGNOSIS:  BRCA-2 positivity.  PROCEDURE:  Bilateral mastectomies.  SURGEON:  Ollen Gross. Vernell Morgans, MD.  ASSISTANT:  Anselm Pancoast. Zachery Dakins, MD.  ANESTHESIA:  General endotracheal.  DESCRIPTION OF PROCEDURE:  After informed consent was obtained, the patient was brought to the operating room, placed in supine position on the operating room table.  After induction of general anesthesia, the patient's chest, breast, and axillary areas were all prepped with ChloraPrep, allowed to dry and then draped in usual sterile fashion. Elliptical incisions were mapped out around the nipple-areolar complex to try to minimize the skin that was removed.  These incisions were then created first on the left side with a 10 blade knife.  This incision was carried down through the skin and subcutaneous tissue sharply with electrocautery.  Once into the breast tissue, skin hooks were used to elevate the skin flaps anteriorly towards the ceiling and thin skin flaps were created circumferentially around the incision with the harmonic scalpel blade and Bovie electrocautery until the dissection was reached the chest wall superiorly and inferiorly.  The dissection laterally was carried out until we reached the latissimus muscle.  Once this was accomplished, then the breast was removed from the pectoralis chest wall muscle with the pectoralis fascia.  This was done sharply with electrocautery.  Once the breast was removed from the patient, it was oriented with stitch on the lateral aspect and sent to pathology. Hemostasis was achieved using  the Bovie electrocautery.  Wound was irrigated with copious amounts of saline and packed with a moistened gauze.  Attention was then turned to the right breast.  Again, the elliptical incision was made with a 10 blade knife.  This incision was carried down through the skin and subcutaneous tissue sharply with electrocautery.  Once into the breast tissue, skin hooks were used to elevate the skin flaps anteriorly towards the ceiling and thin skin flaps were created between the breast tissue and the subcutaneous tissue and this dissection was performed with the harmonic scalpel as well as with electrocautery until the dissection was carried down all the way to the chest wall superiorly and inferiorly.  Laterally, the dissection was carried down to latissimus muscle.  Once this was accomplished, the breast was then removed from the chest wall with the pectoralis fascia. This was done sharply with electrocautery until the breast was completely removed.  It was oriented with a stitch on the lateral aspect and sent to pathology.  Hemostasis was achieved using the Bovie electrocautery.  Wound was irrigated with copious amounts of saline and packed with moistened gauze.  The skin flaps looked good and healthy and the operative bed was clean and dry.  At this point, the case was turned over to Dr. Odis Luster for the reconstruction.  His portion will be dictated separately.  The patient was in good condition at the end of this portion of the case.  At the end of all portion of case, all needle, sponge, and instrument counts were correct.     Ollen Gross. Vernell Morgans, M.D.     PST/MEDQ  D:  01/15/2011  T:  01/15/2011  Job:  [id number]  Electronically Signed by Chevis Pretty III M.D. on 01/20/2011 10:20:03 AM

## 2011-01-22 ENCOUNTER — Ambulatory Visit (HOSPITAL_COMMUNITY)
Admission: RE | Admit: 2011-01-22 | Discharge: 2011-01-22 | Disposition: A | Discharge status: Home / Self Care | Payer: 59 | Source: Ambulatory Visit | Attending: Plastic Surgery | Admitting: Plastic Surgery

## 2011-01-22 ENCOUNTER — Inpatient Hospital Stay (HOSPITAL_COMMUNITY)
Admission: EM | Admit: 2011-01-22 | Discharge: 2011-01-26 | DRG: 301 | Disposition: A | Discharge status: Home / Self Care | Payer: 59 | Attending: Plastic Surgery | Admitting: Plastic Surgery

## 2011-01-22 DIAGNOSIS — C50919 Malignant neoplasm of unspecified site of unspecified female breast: Secondary | ICD-10-CM | POA: Diagnosis present

## 2011-01-22 DIAGNOSIS — I82890 Acute embolism and thrombosis of other specified veins: Secondary | ICD-10-CM | POA: Insufficient documentation

## 2011-01-22 DIAGNOSIS — M79609 Pain in unspecified limb: Secondary | ICD-10-CM | POA: Insufficient documentation

## 2011-01-22 DIAGNOSIS — I824Y9 Acute embolism and thrombosis of unspecified deep veins of unspecified proximal lower extremity: Principal | ICD-10-CM | POA: Diagnosis present

## 2011-01-22 LAB — BASIC METABOLIC PANEL
Calcium: 8.4 mg/dL (ref 8.4–10.5)
Chloride: 98 mEq/L (ref 96–112)
Creatinine, Ser: 0.62 mg/dL (ref 0.50–1.10)
GFR calc Af Amer: >60 mL/min (ref >60)
Sodium: 134 mEq/L — ABNORMAL LOW (ref 135–145)

## 2011-01-22 LAB — DIFFERENTIAL
Basophils Absolute: 0 10*3/uL (ref 0.0–0.1)
Eosinophils Relative: 4 % (ref 0–5)
Lymphocytes Relative: 18 % (ref 12–46)
Lymphs Abs: 1.3 10*3/uL (ref 0.7–4.0)
Neutrophils Relative %: 71 % (ref 43–77)

## 2011-01-22 LAB — APTT: aPTT: 38 seconds — ABNORMAL HIGH (ref 24–37)

## 2011-01-22 LAB — CBC
HCT: 26.9 % — ABNORMAL LOW (ref 36.0–46.0)
Platelets: 290 10*3/uL (ref 150–400)
RBC: 3.16 MIL/uL — ABNORMAL LOW (ref 3.87–5.11)
RDW: 13.5 % (ref 11.5–15.5)
WBC: 7.3 10*3/uL (ref 4.0–10.5)

## 2011-01-22 LAB — PROTIME-INR: INR: 1.01 (ref 0.00–1.49)

## 2011-01-23 LAB — PROTIME-INR
INR: 0.98 (ref 0.00–1.49)
Prothrombin Time: 13.2 seconds (ref 11.6–15.2)

## 2011-01-24 LAB — PROTEIN C ACTIVITY: Protein C Activity: 146 % — ABNORMAL HIGH (ref 75–133)

## 2011-01-24 LAB — PROTEIN S ACTIVITY: Protein S Activity: 79 % (ref 69–129)

## 2011-01-24 LAB — LUPUS ANTICOAGULANT PANEL: PTT Lupus Anticoagulant: 42.2 secs (ref 30.0–45.6)

## 2011-01-24 LAB — ANTITHROMBIN III: AntiThromb III Func: 132 % — ABNORMAL HIGH (ref 76–126)

## 2011-01-24 LAB — FACTOR 5 LEIDEN

## 2011-01-25 LAB — CBC
HCT: 30 % — ABNORMAL LOW (ref 36.0–46.0)
Hemoglobin: 10 g/dL — ABNORMAL LOW (ref 12.0–15.0)
RBC: 3.48 MIL/uL — ABNORMAL LOW (ref 3.87–5.11)
WBC: 6.7 10*3/uL (ref 4.0–10.5)

## 2011-01-25 LAB — BETA-2-GLYCOPROTEIN I ABS, IGG/M/A
Beta-2 Glyco I IgG: 0 G Units (ref <20)
Beta-2-Glycoprotein I IgA: 90 A Units — ABNORMAL HIGH (ref <20)

## 2011-01-25 LAB — CARDIOLIPIN ANTIBODIES, IGG, IGM, IGA: Anticardiolipin IgG: 3 GPL U/mL — ABNORMAL LOW (ref <23)

## 2011-01-25 LAB — PROTIME-INR
INR: 1.89 — ABNORMAL HIGH (ref 0.00–1.49)
Prothrombin Time: 22 seconds — ABNORMAL HIGH (ref 11.6–15.2)

## 2011-01-28 DIAGNOSIS — I82409 Acute embolism and thrombosis of unspecified deep veins of unspecified lower extremity: Secondary | ICD-10-CM | POA: Insufficient documentation

## 2011-01-28 HISTORY — DX: Acute embolism and thrombosis of unspecified deep veins of unspecified lower extremity: I82.409

## 2011-01-28 HISTORY — PX: SIMPLE MASTECTOMY: SHX2409

## 2011-01-30 ENCOUNTER — Encounter (HOSPITAL_BASED_OUTPATIENT_CLINIC_OR_DEPARTMENT_OTHER): Payer: 59 | Admitting: Oncology

## 2011-01-30 ENCOUNTER — Other Ambulatory Visit: Payer: Self-pay | Admitting: Oncology

## 2011-01-30 DIAGNOSIS — Z1501 Genetic susceptibility to malignant neoplasm of breast: Secondary | ICD-10-CM

## 2011-01-30 DIAGNOSIS — I82409 Acute embolism and thrombosis of unspecified deep veins of unspecified lower extremity: Secondary | ICD-10-CM

## 2011-01-30 DIAGNOSIS — Z803 Family history of malignant neoplasm of breast: Secondary | ICD-10-CM

## 2011-01-30 DIAGNOSIS — Z7901 Long term (current) use of anticoagulants: Secondary | ICD-10-CM

## 2011-01-30 LAB — PROTIME-INR
INR: 2.9 (ref 2.00–3.50)
Protime: 34.8 Seconds — ABNORMAL HIGH (ref 10.6–13.4)

## 2011-02-01 ENCOUNTER — Encounter (INDEPENDENT_AMBULATORY_CARE_PROVIDER_SITE_OTHER): Payer: 59 | Admitting: General Surgery

## 2011-02-01 ENCOUNTER — Emergency Department (HOSPITAL_COMMUNITY): Payer: 59

## 2011-02-01 ENCOUNTER — Inpatient Hospital Stay (HOSPITAL_COMMUNITY)
Admission: EM | Admit: 2011-02-01 | Discharge: 2011-02-07 | DRG: 908 | Disposition: A | Discharge status: Home / Self Care | Payer: 59 | Attending: Plastic Surgery | Admitting: Plastic Surgery

## 2011-02-01 ENCOUNTER — Other Ambulatory Visit (HOSPITAL_COMMUNITY): Payer: 59

## 2011-02-01 DIAGNOSIS — J45909 Unspecified asthma, uncomplicated: Secondary | ICD-10-CM | POA: Diagnosis present

## 2011-02-01 DIAGNOSIS — I824Z9 Acute embolism and thrombosis of unspecified deep veins of unspecified distal lower extremity: Secondary | ICD-10-CM

## 2011-02-01 DIAGNOSIS — Z803 Family history of malignant neoplasm of breast: Secondary | ICD-10-CM

## 2011-02-01 DIAGNOSIS — IMO0002 Reserved for concepts with insufficient information to code with codable children: Principal | ICD-10-CM | POA: Diagnosis present

## 2011-02-01 DIAGNOSIS — Y836 Removal of other organ (partial) (total) as the cause of abnormal reaction of the patient, or of later complication, without mention of misadventure at the time of the procedure: Secondary | ICD-10-CM | POA: Diagnosis present

## 2011-02-01 DIAGNOSIS — T45515A Adverse effect of anticoagulants, initial encounter: Secondary | ICD-10-CM | POA: Diagnosis present

## 2011-02-01 DIAGNOSIS — Z1501 Genetic susceptibility to malignant neoplasm of breast: Secondary | ICD-10-CM

## 2011-02-01 DIAGNOSIS — E876 Hypokalemia: Secondary | ICD-10-CM | POA: Diagnosis not present

## 2011-02-01 DIAGNOSIS — I825Z9 Chronic embolism and thrombosis of unspecified deep veins of unspecified distal lower extremity: Secondary | ICD-10-CM | POA: Diagnosis present

## 2011-02-01 DIAGNOSIS — R55 Syncope and collapse: Secondary | ICD-10-CM | POA: Diagnosis present

## 2011-02-01 DIAGNOSIS — K219 Gastro-esophageal reflux disease without esophagitis: Secondary | ICD-10-CM | POA: Diagnosis present

## 2011-02-01 LAB — DIC (DISSEMINATED INTRAVASCULAR COAGULATION)PANEL
Fibrinogen: 156 mg/dL — ABNORMAL LOW (ref 204–475)
Prothrombin Time: 14.5 seconds (ref 11.6–15.2)
Smear Review: NONE SEEN

## 2011-02-01 LAB — DIFFERENTIAL
Basophils Absolute: 0 10*3/uL (ref 0.0–0.1)
Basophils Relative: 0 % (ref 0–1)
Eosinophils Absolute: 0.2 10*3/uL (ref 0.0–0.7)
Monocytes Absolute: 0.7 10*3/uL (ref 0.1–1.0)
Neutro Abs: 10.5 10*3/uL — ABNORMAL HIGH (ref 1.7–7.7)
Neutrophils Relative %: 78 % — ABNORMAL HIGH (ref 43–77)

## 2011-02-01 LAB — RETICULOCYTES
RBC.: 2.5 MIL/uL — ABNORMAL LOW (ref 3.87–5.11)
Retic Count, Absolute: 62.5 10*3/uL (ref 19.0–186.0)
Retic Ct Pct: 2.5 % (ref 0.4–3.1)

## 2011-02-01 LAB — CBC
HCT: 21.5 % — ABNORMAL LOW (ref 36.0–46.0)
Hemoglobin: 9.1 g/dL — ABNORMAL LOW (ref 12.0–15.0)
Hemoglobin: 7.6 g/dL — ABNORMAL LOW (ref 12.0–15.0)
MCH: 29.4 pg (ref 26.0–34.0)
MCH: 30.3 pg (ref 26.0–34.0)
MCHC: 33.5 g/dL (ref 30.0–36.0)
MCHC: 35.3 g/dL (ref 30.0–36.0)
MCV: 85.7 fL (ref 78.0–100.0)
Platelets: 260 10*3/uL (ref 150–400)
Platelets: 234 10*3/uL (ref 150–400)
RBC: 2.51 MIL/uL — ABNORMAL LOW (ref 3.87–5.11)
RDW: 13.9 % (ref 11.5–15.5)
WBC: 12 10*3/uL — ABNORMAL HIGH (ref 4.0–10.5)

## 2011-02-01 LAB — FIBRINOGEN: Fibrinogen: 162 mg/dL — ABNORMAL LOW (ref 204–475)

## 2011-02-01 LAB — BASIC METABOLIC PANEL
BUN: 19 mg/dL (ref 6–23)
Chloride: 102 mEq/L (ref 96–112)
GFR calc Af Amer: >60 mL/min (ref >60)
Glucose, Bld: 120 mg/dL — ABNORMAL HIGH (ref 70–99)
Potassium: 4.1 mEq/L (ref 3.5–5.1)
Sodium: 136 mEq/L (ref 135–145)

## 2011-02-01 LAB — PROTIME-INR
INR: 3.22 — ABNORMAL HIGH (ref 0.00–1.49)
INR: 1.41 (ref 0.00–1.49)
Prothrombin Time: 33.4 seconds — ABNORMAL HIGH (ref 11.6–15.2)
Prothrombin Time: 17.5 s — ABNORMAL HIGH (ref 11.6–15.2)

## 2011-02-01 LAB — APTT: aPTT: 27 s (ref 24–37)

## 2011-02-01 MED ORDER — IOHEXOL 300 MG/ML  SOLN
70.0000 mL | Freq: Once | INTRAMUSCULAR | Status: AC | PRN
  Administered 2011-02-01: 70 mL via INTRAVENOUS

## 2011-02-02 DIAGNOSIS — D65 Disseminated intravascular coagulation [defibrination syndrome]: Secondary | ICD-10-CM

## 2011-02-02 LAB — CBC
HCT: 17.1 % — ABNORMAL LOW (ref 36.0–46.0)
HCT: 25.6 % — ABNORMAL LOW (ref 36.0–46.0)
MCH: 29.7 pg (ref 26.0–34.0)
MCH: 30 pg (ref 26.0–34.0)
MCHC: 35.1 g/dL (ref 30.0–36.0)
MCHC: 34.8 g/dL (ref 30.0–36.0)
MCV: 84.7 fL (ref 78.0–100.0)
MCV: 86.2 fL (ref 78.0–100.0)
RDW: 14 % (ref 11.5–15.5)
RDW: 14.3 % (ref 11.5–15.5)

## 2011-02-02 LAB — PROTIME-INR: INR: 1.26 (ref 0.00–1.49)

## 2011-02-02 LAB — PREPARE FRESH FROZEN PLASMA: Unit division: 0

## 2011-02-02 LAB — BASIC METABOLIC PANEL
BUN: 10 mg/dL (ref 6–23)
Calcium: 7.4 mg/dL — ABNORMAL LOW (ref 8.4–10.5)
Creatinine, Ser: 0.53 mg/dL (ref 0.50–1.10)
GFR calc non Af Amer: >60 mL/min (ref >60)
Glucose, Bld: 134 mg/dL — ABNORMAL HIGH (ref 70–99)
Sodium: 135 mEq/L (ref 135–145)

## 2011-02-03 DIAGNOSIS — D649 Anemia, unspecified: Secondary | ICD-10-CM

## 2011-02-03 LAB — PREPARE CRYOPRECIPITATE
Unit division: 0
Unit division: 0
Unit division: 0
Unit division: 0
Unit division: 0
Unit division: 0

## 2011-02-03 LAB — CBC
HCT: 26.5 % — ABNORMAL LOW (ref 36.0–46.0)
MCH: 29.6 pg (ref 26.0–34.0)
MCV: 87.2 fL (ref 78.0–100.0)
Platelets: 202 10*3/uL (ref 150–400)
RDW: 14.2 % (ref 11.5–15.5)

## 2011-02-03 LAB — CROSSMATCH
Unit division: 0
Unit division: 0

## 2011-02-03 LAB — BASIC METABOLIC PANEL
CO2: 29 mEq/L (ref 19–32)
Calcium: 8 mg/dL — ABNORMAL LOW (ref 8.4–10.5)
Chloride: 102 mEq/L (ref 96–112)
Creatinine, Ser: 0.48 mg/dL — ABNORMAL LOW (ref 0.50–1.10)
Glucose, Bld: 131 mg/dL — ABNORMAL HIGH (ref 70–99)

## 2011-02-04 DIAGNOSIS — D65 Disseminated intravascular coagulation [defibrination syndrome]: Secondary | ICD-10-CM

## 2011-02-04 LAB — BASIC METABOLIC PANEL
BUN: 4 mg/dL — ABNORMAL LOW (ref 6–23)
Calcium: 7.8 mg/dL — ABNORMAL LOW (ref 8.4–10.5)
GFR calc Af Amer: >60 mL/min (ref >60)
GFR calc non Af Amer: >60 mL/min (ref >60)
Glucose, Bld: 115 mg/dL — ABNORMAL HIGH (ref 70–99)
Potassium: 2.8 mEq/L — ABNORMAL LOW (ref 3.5–5.1)

## 2011-02-04 LAB — CBC
HCT: 25 % — ABNORMAL LOW (ref 36.0–46.0)
MCH: 29.9 pg (ref 26.0–34.0)
MCHC: 34.4 g/dL (ref 30.0–36.0)
RDW: 14.1 % (ref 11.5–15.5)

## 2011-02-05 DIAGNOSIS — I82409 Acute embolism and thrombosis of unspecified deep veins of unspecified lower extremity: Secondary | ICD-10-CM

## 2011-02-05 DIAGNOSIS — R58 Hemorrhage, not elsewhere classified: Secondary | ICD-10-CM

## 2011-02-05 LAB — CBC
Hemoglobin: 8.7 g/dL — ABNORMAL LOW (ref 12.0–15.0)
MCH: 29.7 pg (ref 26.0–34.0)
MCHC: 33.9 g/dL (ref 30.0–36.0)
Platelets: 235 10*3/uL (ref 150–400)
RDW: 14.1 % (ref 11.5–15.5)

## 2011-02-05 LAB — BASIC METABOLIC PANEL
Calcium: 8.3 mg/dL — ABNORMAL LOW (ref 8.4–10.5)
Glucose, Bld: 108 mg/dL — ABNORMAL HIGH (ref 70–99)
Potassium: 3.7 mEq/L (ref 3.5–5.1)
Sodium: 136 mEq/L (ref 135–145)

## 2011-02-05 LAB — PROTIME-INR: Prothrombin Time: 14.2 seconds (ref 11.6–15.2)

## 2011-02-05 NOTE — Op Note (Signed)
  Sylvia Hernandez, Sylvia Hernandez              ACCOUNT NO.:  [id number]  MEDICAL RECORD NO.:  [id number]  LOCATION:  5118                         FACILITY:  [facility]  PHYSICIAN:  Etter Sjogren, M.D.     DATE OF BIRTH:  1971/07/23  DATE OF PROCEDURE:  01/15/2011 DATE OF DISCHARGE:                              OPERATIVE REPORT   PREOPERATIVE DIAGNOSIS:  BRCA2 positive.  POSTOPERATIVE DIAGNOSIS:  BRCA2 positive.  PROCEDURE:  Bilateral breast reconstruction with tissue expanders.  SURGEON:  Etter Sjogren, MD  ANESTHESIA:  General.  ESTIMATED BLOOD LOSS:  20 mL.  DRAINS:  Two 19-French on each sides.  CLINICAL NOTE:  This is a 40 year old woman who has a very strong positive family history of breast cancer in multiple sisters and immediate family members and she had genetic testing and was found to be BRCA2 positive.  Given that diagnosis, she elected to go ahead with bilateral mastectomy.  She has also already had bilateral oophorectomy. She did desire reconstruction.  Options were discussed and she elected to have tissue expanders followed later by placement of the implants. The nature of these procedure, the planned staged, nature of them, as well as the risks plus complications include, but not limited to bleeding, infection, anesthesia complications, healing problems, scarring, fluid accumulations, loss of sensation, failure of the device, capsular contracture, displacement of the device, wrinkles, ripples, disappointment, asymmetry, pneumothorax, pulmonary embolism, all of this was discussed and she understood all of this and wished to proceed.  DESCRIPTION:  The patient was taken to the operating room, bilateral mastectomy had been completed.  There were no complications with that. The wounds were irrigated thoroughly with saline.  Hemostasis obtained with electrocautery and then the dissection carried deep to the pectoralis major and serratus muscles taking great care to avoid  damage to the underlying chest cavity.  Thorough irrigation with saline and hemostasis with electrocautery.  After thoroughly cleaning gloves, the expanders were prepared.  These were Mentor 550 mL medium height implants, 100 mL sterile saline placed using the closed filling system and the implants were returned to the antibiotic solution bath. Antibiotic solution also placed and the space had been prepared for submuscular space and then the expanders were positioned and the muscles closed with 3-0 Vicryl interrupted figure-of-eight sutures.  19-French drains were positioned through separate stab wounds inferolaterally and secured with 3-0 Prolene sutures and antibiotic solution again placed in the wound as hemostasis confirmed.  Skin flaps looked very healthy and another 50 mL of sterile saline placed using a closed filling system for the total of 150 mL.  The skin closure with 3-0 Monocryl interrupted inverted deep dermal sutures and Dermabond. Antibiotic ointment dry sterile dressings applied around the drains and dry sterile dressings over the mastectomy site and the chest vest placed, and she was transferred to the recovery room stable and tolerated procedure well.     Etter Sjogren, M.D.     DB/MEDQ  D:  01/15/2011  T:  01/16/2011  Job:  [id number]  Electronically Signed by Etter Sjogren M.D. on 02/05/2011 08:43:01 AM

## 2011-02-05 NOTE — Discharge Summary (Signed)
Sylvia Hernandez, Sylvia Hernandez              ACCOUNT NO.:  [id number]  MEDICAL RECORD NO.:  [id number]  LOCATION:  5118                         FACILITY:  [facility]  PHYSICIAN:  Etter Sjogren, M.D.     DATE OF BIRTH:  [birth year]  DATE OF ADMISSION:  01/22/2011 DATE OF DISCHARGE:  01/26/2011                              DISCHARGE SUMMARY   FINAL DIAGNOSES: 1. Deep thrombosis, bilateral lower extremities. 2. Breast cancer.  SUMMARY OF HISTORY AND PHYSICAL:  This is a 40 year old woman who is positive for the BRCA2 gene, and she elected to have bilateral prophylactic mastectomy.  She has a very strong family history of breast cancer.  She had bilateral mastectomy, bilateral reconstruction with tissue expanders 8 days prior to this admission.  She complained of a little bit of leg pain on that day and was seen in the office and was felt to have possibility of deep venous thrombosis.  She had a positive Homans sign, right greater than left.  She was seen in the vascular lab where a Doppler did reveal DVT in the right and evolving DVT on the left.  She had no symptoms of shortness of breath or chest pain consistent with pulmonary embolism.  On her physical examination, she had swelling in her legs and did have popliteal fossa pain especially on the right, but also on the left side with dorsiflexion of the foot on each side.  For further details of history and physical, please see the chart.  COURSE IN THE HOSPITAL:  On admission, she was placed on Lovenox subcu 100 mg and Coumadin 5 mg p.o.  The case was discussed with Dr. Welton Flakes who is her hematologist/oncologist and that she felt this could be treated as an outpatient, but certainly is planned to admit and bridge with Lovenox.  Dr. Welton Flakes said that she would be glad to take her over as outpatient.  The patient was discharged.  She was placed on bedrest. Legs elevated except for bathroom privileges.  Swelling resolved.  Pain resolved.  The INR was  followed very carefully.  She had a fairly big jump in the INR, but she was placed on Coumadin 7.5 mg, and the Pharmacy feels that 5 mg will be the proper daily dose for her.  Her INR is still just slightly subtherapeutic at 1.89, but it is certainly almost into the therapeutic range, and it was felt that bridging the Lovenox should continue.  That will continue as an outpatient.  In the meantime, the surgical sites have been observed very carefully.  There has been no evidence of any bleeding or hematoma.  She understands that she continues to be at fairly high risk for bleeding at surgical sites given the blood thinners that she will be on, and I have recommended that she has to pass with little bit of light pressure in the area.  Her drains continue to drain of 40-50 mL per day and they remain in place. Subsequently, she will be discharged.  DISPOSITION:  Her husband has been instructed on Lovenox subcu injections, and he is very comfortable giving those.  She will be discharged on Lovenox 100 mg subcu daily and Coumadin 5 mg p.o.  daily. Prescription has been written for her until she is seen in the Coumadin clinic, which will be on Tuesday, January 30, 2011, at 1:00 p.m. and that appointment has been made.  She will continue to do drains 3 times and then record the amounts.  She will continue the antibiotics twice a day, Keflex 5 mg b.i.d. and Percocet 5 mg one to two p.o. q.6 h. p.r.n. for pain.  She will be back in the office next week.  She will call up on Monday, 3 days in order to report the drainage amounts, and we will decide about timing of return to office for drain removal.  FINAL DIAGNOSIS:  Deep venous thrombosis.  CONDITION AT DISCHARGE:  Good.     Etter Sjogren, M.D.     DB/MEDQ  D:  01/26/2011  T:  01/27/2011  Job:  [id number]  Electronically Signed by Etter Sjogren M.D. on 02/05/2011 08:42:55 AM

## 2011-02-05 NOTE — Op Note (Signed)
Sylvia Hernandez, Sylvia Hernandez              ACCOUNT NO.:  [id number]  MEDICAL RECORD NO.:  [id number]  LOCATION:  3305                         FACILITY:  [facility]  PHYSICIAN:  Etter Sjogren, M.D.     DATE OF BIRTH:  05-04-1971  DATE OF PROCEDURE:  02/01/2011 DATE OF DISCHARGE:                              OPERATIVE REPORT   PREOPERATIVE DIAGNOSIS:  Large hematoma, right chest mastectomy reconstruction site.  POSTOPERATIVE DIAGNOSIS:  Large hematoma, right chest mastectomy reconstruction site.  PROCEDURE PERFORMED: 1. Incision and drainage of large hematoma, right chest     mastectomy/reconstruction site. 2. Rearrangement of tissues greater than 90 cm2. 3. Removal of tissue expander, right chest.  No replacement.  SURGEON:  Etter Sjogren, MD  ASSISTANT:  Mary Sella. Andrey Campanile, MD  ANESTHESIA:  General.  ESTIMATED BLOOD LOSS:  10 mL from this procedure but 600 mL of old blood from her acute bleed early this morning was evacuated.  DRAINS:  Two 19-French were left.  CLINICAL NOTE:  This 40 year old woman had bilateral mastectomy and bilateral reconstruction with tissue expanders for BRCA2 positive gene on January 15, 2011.  She did well initially but on the 8th day, she complained of bilateral leg pain.  She was found to have positive popliteal fossa tenderness on dorsi flexion of the feet and she was sent for vascular studies which revealed bilateral deep venous thrombosis. She was admitted to the hospital and anticoagulated.  She did well from the anticoagulation.  She was discharged on 5 mg of Coumadin once a day and 100 mg of Lovenox subcu and her followup appointment was at the Coumadin Clinic on July 3.  Today, she woke up and she noticed that she had a very large hematoma and swelling in the right chest, very painful. She fainted.  She was brought to the emergency room by rescue team and was somewhat hypotensive.  The feeling was that there was a very large hematoma and she was  transfused some blood.  Unfortunately, it was found that her INR was 3.22 with a PT greater than 30.  She was supratherapeutic and it is felt that the bleeding was secondary to the excessive anticoagulation.  It was felt that she should be taken to surgery immediately.  The situation was discussed with her.  She understood that there was a good chance that she would lose tissue expander, especially if the bleeding was found to be in the area of the tissue expander.  Because of the anticoagulated state that it would be very difficult to try to achieve hemostasis and ensure that there would not be blood collection around the tissue expander and that lead to an infection, so she was understood that she had a good chance of losing the tissue expander tonight.  Other risks well understood by her include not limited to bleeding, infection, anesthesia complications, healing problems, scarring, possibly we could further worsen the deep venous thrombosis situation that she has.  She and husband understood all this and wished to proceed.  PROCEDURE:  The patient was taken to the operating room.  After successful induction of general anesthesia, she was prepped with ChloraPrep and after waiting full 3 minutes for drying,  she was draped with sterile drapes.  The old mastectomy scar was opened.  The dissection carried down to the muscle.  The muscle was still intact. The muscle was opened along its lateral border pectoralis major, the serratus.  The old sutures were removed and the deep space was encountered and there was a very large hematoma.  Tissue expander was removed.  Hematoma of approximately 600 mL evacuated.  One small area that was oozing fairly vigorously was suture ligated with 2-0 Vicryl running suture.  The cautery was used to gain hemostasis as much as possible elsewhere.  There was some generalized the oozing throughout the wound consistent with her anticoagulated state, but there  was no significant bleeding whatsoever.  She was irrigated thoroughly with saline.  The 19-French drains were positioned, two of them brought through separate stab wounds inferiorly and secured with 3-0 Prolene sutures.  The closure was then after approximating the muscles again with some 2-0 Vicryl interrupted figure-of-eight sutures.  The skin closure with just Prolene in order to allow for easy access if she developed further bleeding and hematoma while her supratherapeutic anticoagulation is in the process of reversing.  The 3-0 Prolene using vertical mattress sutures interrupted for the skin closure.  Antibiotic ointment, Xeroform gauze, dry sterile dressing, and Ace wrap to apply some pressure placed and she was transferred to the recovery room stable having tolerated procedure well.  DISPOSITION:  She will be admitted to the Step-Down Unit.  Consultations have already been obtained with General Surgery.  Dr. Andrey Campanile did assist and also with Hematology/Oncology, Dr. Darnelle Catalan will be following her from hematologic standpoint and direct any further anticoagulation.  He recommended Lovenox 40 mg subcu one time dose only tonight.     Etter Sjogren, M.D.    DB/MEDQ  D:  02/01/2011  T:  02/02/2011  Job:  [id number]  Electronically Signed by Etter Sjogren M.D. on 02/05/2011 08:43:09 AM

## 2011-02-07 LAB — HEPARIN ANTI-XA: Heparin LMW: 1.01 IU/mL

## 2011-02-08 ENCOUNTER — Encounter (INDEPENDENT_AMBULATORY_CARE_PROVIDER_SITE_OTHER): Payer: 59 | Admitting: General Surgery

## 2011-02-09 ENCOUNTER — Other Ambulatory Visit: Payer: Self-pay | Admitting: Oncology

## 2011-02-09 ENCOUNTER — Encounter (HOSPITAL_BASED_OUTPATIENT_CLINIC_OR_DEPARTMENT_OTHER): Payer: 59 | Admitting: Oncology

## 2011-02-09 DIAGNOSIS — Z803 Family history of malignant neoplasm of breast: Secondary | ICD-10-CM

## 2011-02-09 DIAGNOSIS — Z1501 Genetic susceptibility to malignant neoplasm of breast: Secondary | ICD-10-CM

## 2011-02-09 LAB — PROTIME-INR: Protime: 19.2 Seconds — ABNORMAL HIGH (ref 10.6–13.4)

## 2011-02-09 LAB — CBC WITH DIFFERENTIAL/PLATELET
BASO%: 0.7 % (ref 0.0–2.0)
EOS%: 10 % — ABNORMAL HIGH (ref 0.0–7.0)
LYMPH%: 27.3 % (ref 14.0–49.7)
MCHC: 34.2 g/dL (ref 31.5–36.0)
MONO#: 0.5 10*3/uL (ref 0.1–0.9)
RBC: 3.48 10*6/uL — ABNORMAL LOW (ref 3.70–5.45)
WBC: 5.8 10*3/uL (ref 3.9–10.3)
lymph#: 1.6 10*3/uL (ref 0.9–3.3)

## 2011-02-09 LAB — BASIC METABOLIC PANEL
Chloride: 100 mEq/L (ref 96–112)
Creatinine, Ser: 0.88 mg/dL (ref 0.50–1.10)
Potassium: 4 mEq/L (ref 3.5–5.3)

## 2011-02-09 LAB — HEPARIN ANTI-XA: Heparin LMW: 0.76 IU/mL

## 2011-02-12 ENCOUNTER — Encounter (HOSPITAL_BASED_OUTPATIENT_CLINIC_OR_DEPARTMENT_OTHER): Payer: 59 | Admitting: Oncology

## 2011-02-12 ENCOUNTER — Other Ambulatory Visit: Payer: Self-pay | Admitting: Oncology

## 2011-02-12 DIAGNOSIS — Z803 Family history of malignant neoplasm of breast: Secondary | ICD-10-CM

## 2011-02-12 DIAGNOSIS — Z5181 Encounter for therapeutic drug level monitoring: Secondary | ICD-10-CM

## 2011-02-12 DIAGNOSIS — Z1501 Genetic susceptibility to malignant neoplasm of breast: Secondary | ICD-10-CM

## 2011-02-12 DIAGNOSIS — Z7901 Long term (current) use of anticoagulants: Secondary | ICD-10-CM

## 2011-02-12 LAB — CBC WITH DIFFERENTIAL/PLATELET
Basophils Absolute: 0 10*3/uL (ref 0.0–0.1)
Eosinophils Absolute: 0.4 10*3/uL (ref 0.0–0.5)
HGB: 10.5 g/dL — ABNORMAL LOW (ref 11.6–15.9)
MCV: 89.5 fL (ref 79.5–101.0)
MONO#: 0.5 10*3/uL (ref 0.1–0.9)
MONO%: 10.1 % (ref 0.0–14.0)
NEUT#: 2.4 10*3/uL (ref 1.5–6.5)
RDW: 14 % (ref 11.2–14.5)
WBC: 5.2 10*3/uL (ref 3.9–10.3)
nRBC: 0 % (ref 0–0)

## 2011-02-12 LAB — PROTIME-INR
INR: 2 (ref 2.00–3.50)
Protime: 24 Seconds — ABNORMAL HIGH (ref 10.6–13.4)

## 2011-02-12 NOTE — Consult Note (Signed)
Sylvia Hernandez, Sylvia Hernandez NO.:  [id number]  MEDICAL RECORD NO.:  [id number]  LOCATION:  3305                         FACILITY:  [facility]  PHYSICIAN:  Paula Compton, MD        DATE OF BIRTH:  05/26/1971  DATE OF CONSULTATION:  02/01/2011                                CONSULTATION   PCP:  Helane Rima, MD  CHIEF COMPLAINT:  Severe pain on right arm.  HISTORY OF PRESENT ILLNESS:  The patient is a 40 year old female with a history of surgery in the past January 16, 2011,of a bilateral mastectomy, prophylactic for BRCA2 positive. She was also admitted on January 22, 2011, due to DVT on both lower extremities and was discharged on prophylactic warfarin regime. Few days later, she started to feel sore under her right arm and today in the morning she experienced severe pain on the same area to the point of having a syncopal  episode, her husband called EMS and she is brought to the hospital where she is admitted by Surgery.  Her INR was supratherapeutic and we are called to consult for anticoagulant management.  PAST MEDICAL HISTORY:   Migraines for about 2-3 years  Mild Asthma. PRN nebulizer as tx.  PAST SURGICAL HISTORY:  She had an elective oophorectomy  for the same BRCA2 positive.  SOCIAL HISTORY:  She lives with her husband and child. Occupation, she is a stay-at-home mother.  No tobacco. No alcohol.  No drugs.  FAMILY HISTORY:  Her mother died when she was a child and she does not know the reason.  Her father died of prostate cancer, and she has 8 siblings, 4 of the sisters have been diagnosed with breast cancer.  REVIEW OF SYSTEMS:  Negative for fever, chills or shortness of breath.  ALLERGIES:  She has allergies to [facility] and allergies to CODEINE.  MEDICATIONS:  She was on cephalexin since her surgery.  Also on Colace, Endocet, Methocarbamol, Percocet, and Warfarin.  PHYSICAL EXAMINATION:  VITAL SIGNS:  Temperature 98, pulse 74, respirations 18, her blood  pressure had been systolic between 16-109 and diastolic at 34-76, oxygen she is on 2 L of 100% saturation. GENERAL: Somnolent and pale, comfortably lying in bed. CARDIOVASCULAR:  RRR.  No murmurs. CHEST: Pt has a noticeable edema on R upper anterior thorax to the R shoulder. LUNGS:  Normal bilateral breath sounds.  No rales.  No work of breathing Her O2 "requirement" is due to superficial breathing caused by pain. ABDOMEN:  Soft, nontender, nondistended. EXTREMITIES: Edema on the upper - anterior portion of Right shoulder.  NEURO:  The patient is somnolent due to high dose of pain medication to relieve the pain caused by the collecting hematoma.  LABORATORY DATA AND STUDIES:  She has INR in 3.22 supratherapeutic.  She has a hemoglobin of 9, white count of 13.4, platelets 216.  She has sodium of 136, potassium 4.1, BUN 19, creatinine 0.75, glucose 120, calcium 8, bicarb 24.  She had a chest CT scan at the ED today and she appear to have a large postoperative subacute hematoma/seroma surrounding the right breast implant extending towards the right axilla.  ASSESSMENT AND PLAN: 1. Hematoma.  The patient with a history of mastectomy and post op DVT on Tx with warfarin and INR      supratherapeutic that is bleeding from one of her surgical procedures. We decided to administer vitamin K     and FFP, and recheck INR in 4 hours and tomorrow.  The patient is likely to go to operating     room to have surgery.  2. Anemia.  She is pale and her hemoglobin is 9.1.  She has received     volume expanders of 2 L of normal saline and 3 units of blood. This     is due to bleeding and hematoma forming.  So we will monitor     hemoglobin post op. 3. FEN/GI : NPO NS 4. Prophylaxis: We are holding any anticoagulant Tx until bleeding is controlled, as evidenced by stable serial hemoglobins. 5. Disposition: Pending on patient improvement and normal INR.    ______________________________ Donetta Potts [place] Rolene Arbour, MD   ______________________________ Paula Compton, MD    DP/MEDQ  D:  02/01/2011  T:  02/02/2011  Job:  [id number]  Electronically Signed by Lillia Abed DE LA PAZ  on 02/04/2011 04:25:25 PM Electronically Signed by Paula Compton MD on 02/12/2011 08:51:50 AM

## 2011-02-14 ENCOUNTER — Other Ambulatory Visit: Payer: Self-pay | Admitting: Oncology

## 2011-02-14 ENCOUNTER — Ambulatory Visit: Payer: 59 | Attending: Plastic Surgery | Admitting: Physical Therapy

## 2011-02-14 ENCOUNTER — Encounter (HOSPITAL_BASED_OUTPATIENT_CLINIC_OR_DEPARTMENT_OTHER): Payer: 59 | Admitting: Oncology

## 2011-02-14 DIAGNOSIS — IMO0001 Reserved for inherently not codable concepts without codable children: Secondary | ICD-10-CM | POA: Insufficient documentation

## 2011-02-14 DIAGNOSIS — Z1501 Genetic susceptibility to malignant neoplasm of breast: Secondary | ICD-10-CM

## 2011-02-14 DIAGNOSIS — M24519 Contracture, unspecified shoulder: Secondary | ICD-10-CM | POA: Insufficient documentation

## 2011-02-14 DIAGNOSIS — Z803 Family history of malignant neoplasm of breast: Secondary | ICD-10-CM

## 2011-02-14 DIAGNOSIS — M25519 Pain in unspecified shoulder: Secondary | ICD-10-CM | POA: Insufficient documentation

## 2011-02-14 LAB — PROTIME-INR
INR: 2.2 (ref 2.00–3.50)
Protime: 26.4 Seconds — ABNORMAL HIGH (ref 10.6–13.4)

## 2011-02-16 ENCOUNTER — Ambulatory Visit: Payer: 59 | Admitting: Physical Therapy

## 2011-02-16 ENCOUNTER — Encounter (HOSPITAL_BASED_OUTPATIENT_CLINIC_OR_DEPARTMENT_OTHER): Payer: 59 | Admitting: Oncology

## 2011-02-16 ENCOUNTER — Other Ambulatory Visit: Payer: Self-pay | Admitting: Oncology

## 2011-02-16 DIAGNOSIS — Z1501 Genetic susceptibility to malignant neoplasm of breast: Secondary | ICD-10-CM

## 2011-02-16 DIAGNOSIS — Z803 Family history of malignant neoplasm of breast: Secondary | ICD-10-CM

## 2011-02-16 LAB — PROTIME-INR: INR: 1.8 — ABNORMAL LOW (ref 2.00–3.50)

## 2011-02-19 ENCOUNTER — Ambulatory Visit: Payer: 59 | Admitting: Physical Therapy

## 2011-02-21 ENCOUNTER — Ambulatory Visit: Payer: 59

## 2011-02-22 ENCOUNTER — Ambulatory Visit (INDEPENDENT_AMBULATORY_CARE_PROVIDER_SITE_OTHER): Payer: 59 | Admitting: General Surgery

## 2011-02-22 ENCOUNTER — Encounter (INDEPENDENT_AMBULATORY_CARE_PROVIDER_SITE_OTHER): Payer: Self-pay | Admitting: General Surgery

## 2011-02-22 DIAGNOSIS — Z1501 Genetic susceptibility to malignant neoplasm of breast: Secondary | ICD-10-CM

## 2011-02-22 NOTE — Patient Instructions (Signed)
Continue to follow up with Dr. Odis Luster

## 2011-02-23 ENCOUNTER — Ambulatory Visit: Payer: 59 | Admitting: Physical Therapy

## 2011-02-26 ENCOUNTER — Ambulatory Visit: Payer: 59

## 2011-02-26 NOTE — Progress Notes (Signed)
Subjective:     Patient ID: Sylvia Hernandez, female   DOB: 1970/09/23, 40 y.o.   MRN: [id number]  HPI The patient is a 40 year old white female who is now about a month out from bilateral mastectomies for BRCA2  positivity. Her postoperative course has been complicated by clots in both legs requiring anticoagulation as well as the subsequent bleed on the right side requiring removal of the tissue expander. Her only current complaint is of soreness. Review of Systems     Objective:   Physical Exam On exam both mastectomy incisions are healing nicely. Her skin is healthy.    Assessment:     One month postop from bilateral mastectomies for BRCA2 positivity.    Plan:     She appears to be healing well. She will continue working with physical therapy. She will continue to follow with Dr. Odis Luster and we will plan to see her back in another month or 2.

## 2011-02-28 ENCOUNTER — Ambulatory Visit: Payer: 59 | Attending: Plastic Surgery | Admitting: Physical Therapy

## 2011-02-28 DIAGNOSIS — M25519 Pain in unspecified shoulder: Secondary | ICD-10-CM | POA: Insufficient documentation

## 2011-02-28 DIAGNOSIS — M24519 Contracture, unspecified shoulder: Secondary | ICD-10-CM | POA: Insufficient documentation

## 2011-02-28 DIAGNOSIS — M25619 Stiffness of unspecified shoulder, not elsewhere classified: Secondary | ICD-10-CM | POA: Insufficient documentation

## 2011-02-28 DIAGNOSIS — IMO0001 Reserved for inherently not codable concepts without codable children: Secondary | ICD-10-CM | POA: Insufficient documentation

## 2011-03-02 ENCOUNTER — Ambulatory Visit: Payer: 59 | Admitting: Physical Therapy

## 2011-03-05 ENCOUNTER — Ambulatory Visit: Payer: 59

## 2011-03-07 ENCOUNTER — Ambulatory Visit: Payer: 59 | Admitting: Physical Therapy

## 2011-03-09 ENCOUNTER — Ambulatory Visit: Payer: 59 | Admitting: Physical Therapy

## 2011-03-12 ENCOUNTER — Ambulatory Visit: Payer: 59 | Admitting: Physical Therapy

## 2011-03-14 ENCOUNTER — Ambulatory Visit: Payer: 59

## 2011-03-14 NOTE — Discharge Summary (Signed)
NAMEJALAINE, Sylvia Hernandez              ACCOUNT NO.:  [id number]  MEDICAL RECORD NO.:  [id number]  LOCATION:  5152                         FACILITY:  [facility]  PHYSICIAN:  Etter Sjogren, M.D.     DATE OF BIRTH:  07-28-1971  DATE OF ADMISSION:  02/01/2011 DATE OF DISCHARGE:  02/07/2011                              DISCHARGE SUMMARY   FINAL DIAGNOSES: 1. Bilateral deep venous thrombosis. 2. Right chest hematoma post bilateral mastectomy and reconstruction     with tissue expanders. 3. BRACA2 positive gene.  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  This was approximately 3 weeks ago.  She did well from that initial surgery but 8 days after the surgery she complained of leg pain and was found to have bilateral deep venous thrombosis.  She was placed on full anticoagulation and was followed in [facility].  Unfortunately, she bled from the anticoagulation and presented on the day of admission with a significant swelling in the right chest, pain, and was hypotensive.  For further details of the history and physical, please see the chart.  COURSE IN THE HOSPITAL:  On admission, she was taken to surgery, at which time the large hematoma was drained and the tissue expander was removed.  There was no evidence of any infection.  Drains were also placed.  Postoperatively, she stabilized.  She had two more units of blood and no further evidence of bleeding and did well in the step-down unit.  She was on anticoagulation.  It was started very gradually with Coumadin as well as with Lovenox which was gradually increased to 40 mg subcu twice a day.  This was a good level for her, she is therapeutic. Her low molecular weight heparin level has been checked 4 hours post injection, and it is  therapeutic at 7.  The Coumadin INR continues at 1.0.  There has been no further evidence of bleeding.  The chest looks very good and one of the drains is removed today prior to discharge, one will remain.  It is felt that she may be discharged, and the patient feels comfortable going home with the plan of Coumadin 5 mg daily and Lovenox 80 mg subcu daily and follow up with Dr. Welton Flakes very soon at the [facility].  DISPOSITION:  She will be discharged on Coumadin 5 mg daily to be taken in the evening, Lovenox 80 mg subcu daily in the mornings and she has some Robaxin at home that she can take as a muscle relaxant 500 mg one p.o. q.12 h. p.r.n. muscle spasm and empty the drain three times and record the amount.  No shower yet and follow up with me in the office early next week.  FINAL DIAGNOSES: 1. Right chest hematoma. 2. BRACA2 positive gene. 3. Deep venous thrombosis.     Etter Sjogren, M.D.     DB/MEDQ  D:  02/07/2011  T:  02/07/2011  Job:  [id number]  cc:   Drue Second, M.D. Lum Keas, MD Ollen Gross. Vernell Morgans, M.D.  Electronically Signed by Etter Sjogren M.D. on 03/14/2011 02:57:03 PM

## 2011-03-16 ENCOUNTER — Ambulatory Visit: Payer: 59 | Admitting: Physical Therapy

## 2011-03-19 ENCOUNTER — Ambulatory Visit: Payer: 59 | Admitting: Physical Therapy

## 2011-03-21 ENCOUNTER — Ambulatory Visit: Payer: 59

## 2011-03-23 ENCOUNTER — Ambulatory Visit: Payer: 59 | Admitting: Physical Therapy

## 2011-03-26 ENCOUNTER — Other Ambulatory Visit: Payer: Self-pay | Admitting: Oncology

## 2011-03-26 ENCOUNTER — Encounter: Payer: 59 | Admitting: Physical Therapy

## 2011-03-26 ENCOUNTER — Encounter: Payer: 59 | Admitting: Oncology

## 2011-03-26 LAB — PROTIME-INR

## 2011-03-26 LAB — PROTHROMBIN TIME
INR: 4.05 — ABNORMAL HIGH (ref <1.50)
Prothrombin Time: 40 s — ABNORMAL HIGH (ref 11.6–15.2)

## 2011-03-28 ENCOUNTER — Encounter: Payer: 59 | Admitting: Physical Therapy

## 2011-03-30 ENCOUNTER — Encounter: Payer: 59 | Admitting: Physical Therapy

## 2011-04-03 ENCOUNTER — Encounter (HOSPITAL_BASED_OUTPATIENT_CLINIC_OR_DEPARTMENT_OTHER): Payer: 59 | Admitting: Oncology

## 2011-04-03 ENCOUNTER — Other Ambulatory Visit: Payer: Self-pay | Admitting: Oncology

## 2011-04-03 DIAGNOSIS — Z803 Family history of malignant neoplasm of breast: Secondary | ICD-10-CM

## 2011-04-03 DIAGNOSIS — Z7901 Long term (current) use of anticoagulants: Secondary | ICD-10-CM

## 2011-04-03 DIAGNOSIS — Z1501 Genetic susceptibility to malignant neoplasm of breast: Secondary | ICD-10-CM

## 2011-04-03 DIAGNOSIS — I82409 Acute embolism and thrombosis of unspecified deep veins of unspecified lower extremity: Secondary | ICD-10-CM

## 2011-04-03 LAB — PROTIME-INR
INR: 1.3 — ABNORMAL LOW (ref 2.00–3.50)
Protime: 15.6 Seconds — ABNORMAL HIGH (ref 10.6–13.4)

## 2011-04-04 ENCOUNTER — Encounter: Payer: 59 | Admitting: Physical Therapy

## 2011-04-06 ENCOUNTER — Encounter: Payer: 59 | Admitting: Physical Therapy

## 2011-04-09 ENCOUNTER — Encounter: Payer: 59 | Admitting: Physical Therapy

## 2011-04-11 ENCOUNTER — Encounter (HOSPITAL_BASED_OUTPATIENT_CLINIC_OR_DEPARTMENT_OTHER): Payer: 59 | Admitting: Oncology

## 2011-04-11 ENCOUNTER — Other Ambulatory Visit: Payer: Self-pay | Admitting: Oncology

## 2011-04-11 DIAGNOSIS — I82409 Acute embolism and thrombosis of unspecified deep veins of unspecified lower extremity: Secondary | ICD-10-CM

## 2011-04-11 DIAGNOSIS — Z803 Family history of malignant neoplasm of breast: Secondary | ICD-10-CM

## 2011-04-11 DIAGNOSIS — Z7901 Long term (current) use of anticoagulants: Secondary | ICD-10-CM

## 2011-04-11 DIAGNOSIS — Z1501 Genetic susceptibility to malignant neoplasm of breast: Secondary | ICD-10-CM

## 2011-04-13 ENCOUNTER — Encounter: Payer: 59 | Admitting: Physical Therapy

## 2011-04-17 ENCOUNTER — Ambulatory Visit: Payer: 59 | Attending: Plastic Surgery | Admitting: Physical Therapy

## 2011-04-17 DIAGNOSIS — M25519 Pain in unspecified shoulder: Secondary | ICD-10-CM | POA: Insufficient documentation

## 2011-04-17 DIAGNOSIS — IMO0001 Reserved for inherently not codable concepts without codable children: Secondary | ICD-10-CM | POA: Insufficient documentation

## 2011-04-17 DIAGNOSIS — M25619 Stiffness of unspecified shoulder, not elsewhere classified: Secondary | ICD-10-CM | POA: Insufficient documentation

## 2011-04-17 DIAGNOSIS — M24519 Contracture, unspecified shoulder: Secondary | ICD-10-CM | POA: Insufficient documentation

## 2011-04-18 ENCOUNTER — Encounter (HOSPITAL_BASED_OUTPATIENT_CLINIC_OR_DEPARTMENT_OTHER): Payer: 59 | Admitting: Oncology

## 2011-04-18 ENCOUNTER — Other Ambulatory Visit: Payer: Self-pay | Admitting: Oncology

## 2011-04-18 DIAGNOSIS — I82409 Acute embolism and thrombosis of unspecified deep veins of unspecified lower extremity: Secondary | ICD-10-CM

## 2011-04-18 DIAGNOSIS — Z803 Family history of malignant neoplasm of breast: Secondary | ICD-10-CM

## 2011-04-18 DIAGNOSIS — Z1501 Genetic susceptibility to malignant neoplasm of breast: Secondary | ICD-10-CM

## 2011-04-18 DIAGNOSIS — Z7901 Long term (current) use of anticoagulants: Secondary | ICD-10-CM

## 2011-04-18 LAB — PROTIME-INR: INR: 2.4 (ref 2.00–3.50)

## 2011-04-19 ENCOUNTER — Ambulatory Visit: Payer: 59 | Admitting: Physical Therapy

## 2011-04-23 ENCOUNTER — Ambulatory Visit: Payer: 59 | Admitting: Physical Therapy

## 2011-04-23 ENCOUNTER — Encounter (HOSPITAL_BASED_OUTPATIENT_CLINIC_OR_DEPARTMENT_OTHER): Payer: 59 | Admitting: Oncology

## 2011-04-23 DIAGNOSIS — I82409 Acute embolism and thrombosis of unspecified deep veins of unspecified lower extremity: Secondary | ICD-10-CM

## 2011-04-23 DIAGNOSIS — Z7901 Long term (current) use of anticoagulants: Secondary | ICD-10-CM

## 2011-04-23 DIAGNOSIS — Z1501 Genetic susceptibility to malignant neoplasm of breast: Secondary | ICD-10-CM

## 2011-04-23 DIAGNOSIS — Z9010 Acquired absence of unspecified breast and nipple: Secondary | ICD-10-CM

## 2011-04-24 ENCOUNTER — Ambulatory Visit: Payer: 59 | Admitting: Physical Therapy

## 2011-04-25 ENCOUNTER — Other Ambulatory Visit: Payer: Self-pay | Admitting: Oncology

## 2011-04-25 ENCOUNTER — Encounter (HOSPITAL_BASED_OUTPATIENT_CLINIC_OR_DEPARTMENT_OTHER): Payer: 59 | Admitting: Oncology

## 2011-04-25 DIAGNOSIS — Z1501 Genetic susceptibility to malignant neoplasm of breast: Secondary | ICD-10-CM

## 2011-04-25 DIAGNOSIS — Z7901 Long term (current) use of anticoagulants: Secondary | ICD-10-CM

## 2011-04-25 DIAGNOSIS — Z803 Family history of malignant neoplasm of breast: Secondary | ICD-10-CM

## 2011-04-25 DIAGNOSIS — I82409 Acute embolism and thrombosis of unspecified deep veins of unspecified lower extremity: Secondary | ICD-10-CM

## 2011-04-25 LAB — PROTIME-INR: Protime: 26.4 Seconds — ABNORMAL HIGH (ref 10.6–13.4)

## 2011-04-26 ENCOUNTER — Encounter (INDEPENDENT_AMBULATORY_CARE_PROVIDER_SITE_OTHER): Payer: 59 | Admitting: General Surgery

## 2011-04-27 ENCOUNTER — Ambulatory Visit: Payer: 59 | Admitting: Physical Therapy

## 2011-04-30 ENCOUNTER — Ambulatory Visit: Payer: 59 | Attending: Plastic Surgery | Admitting: Physical Therapy

## 2011-04-30 DIAGNOSIS — M25519 Pain in unspecified shoulder: Secondary | ICD-10-CM | POA: Insufficient documentation

## 2011-04-30 DIAGNOSIS — IMO0001 Reserved for inherently not codable concepts without codable children: Secondary | ICD-10-CM | POA: Insufficient documentation

## 2011-04-30 DIAGNOSIS — M25619 Stiffness of unspecified shoulder, not elsewhere classified: Secondary | ICD-10-CM | POA: Insufficient documentation

## 2011-04-30 DIAGNOSIS — M24519 Contracture, unspecified shoulder: Secondary | ICD-10-CM | POA: Insufficient documentation

## 2011-05-02 ENCOUNTER — Ambulatory Visit: Payer: 59 | Admitting: Physical Therapy

## 2011-05-02 ENCOUNTER — Other Ambulatory Visit: Payer: Self-pay | Admitting: Oncology

## 2011-05-02 ENCOUNTER — Encounter (HOSPITAL_BASED_OUTPATIENT_CLINIC_OR_DEPARTMENT_OTHER): Payer: 59 | Admitting: Oncology

## 2011-05-02 DIAGNOSIS — Z1501 Genetic susceptibility to malignant neoplasm of breast: Secondary | ICD-10-CM

## 2011-05-02 DIAGNOSIS — Z803 Family history of malignant neoplasm of breast: Secondary | ICD-10-CM

## 2011-05-02 DIAGNOSIS — Z7901 Long term (current) use of anticoagulants: Secondary | ICD-10-CM

## 2011-05-02 DIAGNOSIS — I82409 Acute embolism and thrombosis of unspecified deep veins of unspecified lower extremity: Secondary | ICD-10-CM

## 2011-05-02 LAB — PROTIME-INR: Protime: 32.4 Seconds — ABNORMAL HIGH (ref 10.6–13.4)

## 2011-05-04 ENCOUNTER — Ambulatory Visit: Payer: 59 | Admitting: Physical Therapy

## 2011-05-07 ENCOUNTER — Ambulatory Visit: Payer: 59 | Admitting: Physical Therapy

## 2011-05-09 ENCOUNTER — Ambulatory Visit: Payer: 59 | Admitting: Physical Therapy

## 2011-05-09 ENCOUNTER — Other Ambulatory Visit: Payer: Self-pay | Admitting: Oncology

## 2011-05-09 ENCOUNTER — Encounter (HOSPITAL_BASED_OUTPATIENT_CLINIC_OR_DEPARTMENT_OTHER): Payer: 59 | Admitting: Oncology

## 2011-05-09 DIAGNOSIS — Z803 Family history of malignant neoplasm of breast: Secondary | ICD-10-CM

## 2011-05-09 DIAGNOSIS — Z7901 Long term (current) use of anticoagulants: Secondary | ICD-10-CM

## 2011-05-09 DIAGNOSIS — I82409 Acute embolism and thrombosis of unspecified deep veins of unspecified lower extremity: Secondary | ICD-10-CM

## 2011-05-09 DIAGNOSIS — Z1501 Genetic susceptibility to malignant neoplasm of breast: Secondary | ICD-10-CM

## 2011-05-09 LAB — PROTIME-INR: Protime: 38.4 Seconds — ABNORMAL HIGH (ref 10.6–13.4)

## 2011-05-11 ENCOUNTER — Ambulatory Visit: Payer: 59 | Admitting: Physical Therapy

## 2011-05-14 ENCOUNTER — Ambulatory Visit: Payer: 59 | Admitting: Physical Therapy

## 2011-05-16 ENCOUNTER — Other Ambulatory Visit: Payer: Self-pay | Admitting: Oncology

## 2011-05-16 ENCOUNTER — Ambulatory Visit: Payer: 59 | Admitting: Physical Therapy

## 2011-05-16 ENCOUNTER — Encounter (HOSPITAL_BASED_OUTPATIENT_CLINIC_OR_DEPARTMENT_OTHER): Payer: 59 | Admitting: Oncology

## 2011-05-16 DIAGNOSIS — I82409 Acute embolism and thrombosis of unspecified deep veins of unspecified lower extremity: Secondary | ICD-10-CM

## 2011-05-16 DIAGNOSIS — Z1501 Genetic susceptibility to malignant neoplasm of breast: Secondary | ICD-10-CM

## 2011-05-16 DIAGNOSIS — Z803 Family history of malignant neoplasm of breast: Secondary | ICD-10-CM

## 2011-05-16 DIAGNOSIS — Z7901 Long term (current) use of anticoagulants: Secondary | ICD-10-CM

## 2011-05-16 LAB — PROTIME-INR
INR: 1.5 — ABNORMAL LOW (ref 2.00–3.50)
Protime: 18 Seconds — ABNORMAL HIGH (ref 10.6–13.4)

## 2011-05-18 ENCOUNTER — Ambulatory Visit: Payer: 59 | Admitting: Physical Therapy

## 2011-05-19 ENCOUNTER — Other Ambulatory Visit: Payer: Self-pay | Admitting: Oncology

## 2011-05-19 DIAGNOSIS — I8290 Acute embolism and thrombosis of unspecified vein: Secondary | ICD-10-CM

## 2011-05-21 ENCOUNTER — Ambulatory Visit: Payer: 59 | Admitting: Physical Therapy

## 2011-05-23 ENCOUNTER — Encounter (HOSPITAL_BASED_OUTPATIENT_CLINIC_OR_DEPARTMENT_OTHER): Payer: 59 | Admitting: Oncology

## 2011-05-23 ENCOUNTER — Ambulatory Visit: Payer: 59 | Admitting: Physical Therapy

## 2011-05-23 ENCOUNTER — Other Ambulatory Visit: Payer: Self-pay | Admitting: Oncology

## 2011-05-23 DIAGNOSIS — Z7901 Long term (current) use of anticoagulants: Secondary | ICD-10-CM

## 2011-05-23 DIAGNOSIS — Z1501 Genetic susceptibility to malignant neoplasm of breast: Secondary | ICD-10-CM

## 2011-05-23 DIAGNOSIS — I82409 Acute embolism and thrombosis of unspecified deep veins of unspecified lower extremity: Secondary | ICD-10-CM

## 2011-05-23 DIAGNOSIS — Z803 Family history of malignant neoplasm of breast: Secondary | ICD-10-CM

## 2011-05-23 LAB — PROTIME-INR
INR: 2.1 (ref 2.00–3.50)
Protime: 25.2 Seconds — ABNORMAL HIGH (ref 10.6–13.4)

## 2011-05-25 ENCOUNTER — Ambulatory Visit: Payer: 59 | Admitting: Physical Therapy

## 2011-05-28 ENCOUNTER — Ambulatory Visit (INDEPENDENT_AMBULATORY_CARE_PROVIDER_SITE_OTHER): Payer: 59 | Admitting: General Surgery

## 2011-05-28 VITALS — BP 122/86 | HR 66 | Temp 97.4°F | Resp 14 | Ht 62.0 in | Wt 139.4 lb

## 2011-05-28 DIAGNOSIS — Z1501 Genetic susceptibility to malignant neoplasm of breast: Secondary | ICD-10-CM

## 2011-05-28 NOTE — Patient Instructions (Signed)
Continue to work with physical therapy

## 2011-05-29 ENCOUNTER — Ambulatory Visit: Payer: 59 | Admitting: Physical Therapy

## 2011-05-29 NOTE — Progress Notes (Signed)
Subjective:     Patient ID: Sylvia Hernandez, female   DOB: November 04, 1970, 40 y.o.   MRN: [id number]  HPI The patient is a 40 year old white female who is 4 months out from bilateral mastectomies done prophylactically for BRCA2 positivity. Her postoperative course was complicated by clots in her legs requiring anticoagulation. She then bled from her mastectomy site on the right requiring removal of the tissue expander. She has also developed frozen shoulders bilaterally and has seen Dr. Thomasena Edis in orthopedic surgery for this.  Review of Systems  Constitutional: Negative.   HENT: Negative.   Eyes: Negative.   Respiratory: Negative.   Cardiovascular: Negative.   Gastrointestinal: Negative.   Genitourinary: Negative.   Musculoskeletal: Positive for arthralgias.  Skin: Negative.   Neurological: Negative.   Hematological: Negative.   Psychiatric/Behavioral: Negative.        Objective:   Physical Exam  Constitutional: She is oriented to person, place, and time. She appears well-developed and well-nourished.  HENT:  Head: Normocephalic and atraumatic.  Eyes: Conjunctivae and EOM are normal. Pupils are equal, round, and reactive to light.  Neck: Normal range of motion. Neck supple.  Cardiovascular: Normal rate, regular rhythm and normal heart sounds.   Pulmonary/Chest: Effort normal and breath sounds normal.       No palpable mass of either chest wall. Her mastectomy incisions have healed nicely. No axillary supraclavicular or cervical lymphadenopathy  Abdominal: Soft. Bowel sounds are normal. She exhibits no mass. There is no tenderness.  Musculoskeletal:       Frozen shoulders bilaterally  Neurological: She is alert and oriented to person, place, and time.  Skin: Skin is warm and dry.  Psychiatric: She has a normal mood and affect. Her behavior is normal.       Assessment:     4 months status post bilateral mastectomies prophylactically    Plan:     At this point she will  continue to work with Dr. Thomasena Edis on the range of motion of her shoulders. We will plan to see her back in about 3 months

## 2011-05-30 ENCOUNTER — Encounter: Payer: 59 | Admitting: Physical Therapy

## 2011-05-30 ENCOUNTER — Other Ambulatory Visit: Payer: Self-pay | Admitting: Oncology

## 2011-05-30 ENCOUNTER — Encounter (HOSPITAL_BASED_OUTPATIENT_CLINIC_OR_DEPARTMENT_OTHER): Payer: 59 | Admitting: Oncology

## 2011-05-30 DIAGNOSIS — Z803 Family history of malignant neoplasm of breast: Secondary | ICD-10-CM

## 2011-05-30 DIAGNOSIS — Z7901 Long term (current) use of anticoagulants: Secondary | ICD-10-CM

## 2011-05-30 DIAGNOSIS — Z1501 Genetic susceptibility to malignant neoplasm of breast: Secondary | ICD-10-CM

## 2011-05-30 DIAGNOSIS — I82409 Acute embolism and thrombosis of unspecified deep veins of unspecified lower extremity: Secondary | ICD-10-CM

## 2011-05-30 LAB — PROTIME-INR

## 2011-06-01 ENCOUNTER — Ambulatory Visit: Payer: 59 | Attending: Plastic Surgery | Admitting: Physical Therapy

## 2011-06-01 DIAGNOSIS — M25619 Stiffness of unspecified shoulder, not elsewhere classified: Secondary | ICD-10-CM | POA: Insufficient documentation

## 2011-06-01 DIAGNOSIS — M25519 Pain in unspecified shoulder: Secondary | ICD-10-CM | POA: Insufficient documentation

## 2011-06-01 DIAGNOSIS — IMO0001 Reserved for inherently not codable concepts without codable children: Secondary | ICD-10-CM | POA: Insufficient documentation

## 2011-06-01 DIAGNOSIS — M24519 Contracture, unspecified shoulder: Secondary | ICD-10-CM | POA: Insufficient documentation

## 2011-06-04 ENCOUNTER — Ambulatory Visit: Payer: 59

## 2011-06-06 ENCOUNTER — Ambulatory Visit: Payer: 59

## 2011-06-06 ENCOUNTER — Other Ambulatory Visit: Payer: Self-pay | Admitting: Oncology

## 2011-06-06 ENCOUNTER — Other Ambulatory Visit (HOSPITAL_BASED_OUTPATIENT_CLINIC_OR_DEPARTMENT_OTHER): Payer: 59

## 2011-06-06 ENCOUNTER — Encounter: Payer: Self-pay | Admitting: *Deleted

## 2011-06-06 DIAGNOSIS — I8290 Acute embolism and thrombosis of unspecified vein: Secondary | ICD-10-CM

## 2011-06-06 DIAGNOSIS — I82409 Acute embolism and thrombosis of unspecified deep veins of unspecified lower extremity: Secondary | ICD-10-CM

## 2011-06-06 DIAGNOSIS — Z5181 Encounter for therapeutic drug level monitoring: Secondary | ICD-10-CM

## 2011-06-06 DIAGNOSIS — Z803 Family history of malignant neoplasm of breast: Secondary | ICD-10-CM

## 2011-06-06 DIAGNOSIS — Z7901 Long term (current) use of anticoagulants: Secondary | ICD-10-CM

## 2011-06-06 LAB — PROTIME-INR

## 2011-06-06 NOTE — Progress Notes (Signed)
Per MD, pt is to remain on present coumadin dose of 5mg  coumadin and 2.5mg  on the other days. Pt is to repeat labs in 1 week

## 2011-06-08 ENCOUNTER — Encounter (HOSPITAL_COMMUNITY): Payer: Self-pay | Admitting: *Deleted

## 2011-06-08 ENCOUNTER — Emergency Department (HOSPITAL_COMMUNITY)
Admission: EM | Admit: 2011-06-08 | Discharge: 2011-06-08 | Disposition: A | Discharge status: Home / Self Care | Payer: 59 | Attending: Emergency Medicine | Admitting: Emergency Medicine

## 2011-06-08 DIAGNOSIS — M25561 Pain in right knee: Secondary | ICD-10-CM

## 2011-06-08 DIAGNOSIS — Z86718 Personal history of other venous thrombosis and embolism: Secondary | ICD-10-CM | POA: Insufficient documentation

## 2011-06-08 DIAGNOSIS — M79609 Pain in unspecified limb: Secondary | ICD-10-CM

## 2011-06-08 DIAGNOSIS — Z7901 Long term (current) use of anticoagulants: Secondary | ICD-10-CM | POA: Insufficient documentation

## 2011-06-08 DIAGNOSIS — M25569 Pain in unspecified knee: Secondary | ICD-10-CM | POA: Insufficient documentation

## 2011-06-08 DIAGNOSIS — K219 Gastro-esophageal reflux disease without esophagitis: Secondary | ICD-10-CM | POA: Insufficient documentation

## 2011-06-08 DIAGNOSIS — J45909 Unspecified asthma, uncomplicated: Secondary | ICD-10-CM | POA: Insufficient documentation

## 2011-06-08 DIAGNOSIS — R209 Unspecified disturbances of skin sensation: Secondary | ICD-10-CM | POA: Insufficient documentation

## 2011-06-08 LAB — PROTIME-INR: Prothrombin Time: 20.7 seconds — ABNORMAL HIGH (ref 11.6–15.2)

## 2011-06-08 MED ORDER — NAPROXEN SODIUM 220 MG PO TABS: 220.0000 mg | ORAL_TABLET | Freq: Two times a day (BID) | ORAL | Status: DC

## 2011-06-08 NOTE — ED Provider Notes (Signed)
History     CSN: [id number] Arrival date & time: 06/08/2011  3:46 PM   First MD Initiated Contact with Patient 06/08/11 2031      Chief Complaint  Patient presents with  . Leg Pain    (Consider location/radiation/quality/duration/timing/severity/associated sxs/prior treatment) Patient is a 40 y.o. female presenting with leg pain. The history is provided by the patient.  Leg Pain  The incident occurred more than 1 week ago. The incident occurred at home. There was no injury mechanism. The pain is present in the right thigh. The quality of the pain is described as aching, burning and sharp. The pain is moderate. The pain has been intermittent since onset. Associated symptoms include numbness. Pertinent negatives include no inability to bear weight, no loss of motion, no muscle weakness, no loss of sensation and no tingling. She reports no foreign bodies present. The symptoms are aggravated by nothing. She has tried nothing for the symptoms.    Past Medical History  Diagnosis Date  . Asthma   . Anxiety   . GERD (gastroesophageal reflux disease)   . Migraine   . Allergy   . FHx: BRCA2 gene positive   . History of blood clots     Past Surgical History  Procedure Date  . Cesarean section   . Abdominal hysterectomy   . Tympanomastoidectomy   . Ovary surgery   . Breast surgery     bil masty    Family History  Problem Relation Age of Onset  . Prostate cancer Father   . Breast cancer Sister   . Breast cancer Sister   . Breast cancer Sister   . Diabetes Brother   . Hypertension Brother     History  Substance Use Topics  . Smoking status: Never Smoker   . Smokeless tobacco: Never Used  . Alcohol Use: No    OB History    Grav Para Term Preterm Abortions TAB SAB Ect Mult Living                  Review of Systems  Constitutional: Negative for fever and chills.  HENT: Negative for ear discharge.   Neurological: Positive for numbness. Negative for tingling.  All  other systems reviewed and are negative.    Allergies  Codeine; Ibuprofen; and Shellfish allergy  Home Medications   Current Outpatient Rx  Name Route Sig Dispense Refill  . WARFARIN SODIUM 5 MG PO TABS Oral Take 5 mg by mouth daily. Monday,wednesday,and Friday 5mg .  All other days 2.5mg .    . ALBUTEROL SULFATE HFA 108 (90 BASE) MCG/ACT IN AERS Inhalation Inhale 2 puffs into the lungs daily as needed.       BP 122/83  Pulse 98  Temp 98 F (36.7 C)  Resp 16  SpO2 98%  Physical Exam  Nursing note and vitals reviewed. Constitutional: She is oriented to person, place, and time. She appears well-developed and well-nourished. No distress.  HENT:  Head: Normocephalic and atraumatic.  Eyes: Conjunctivae are normal. Pupils are equal, round, and reactive to light.  Neck: Normal range of motion. Neck supple.  Cardiovascular: Normal rate and regular rhythm.   Pulmonary/Chest: Effort normal and breath sounds normal. No respiratory distress.  Abdominal: Soft. There is no tenderness.  Musculoskeletal: Normal range of motion. She exhibits no edema and no tenderness.  Neurological: She is alert and oriented to person, place, and time. She has normal strength. A sensory deficit (reports numbness to L leg with palpation) is present. No  cranial nerve deficit. Coordination normal.    ED Course  Procedures (including critical care time)  Labs Reviewed  PROTIME-INR - Abnormal; Notable for the following:    Prothrombin Time 20.7 (*)    INR 1.74 (*)    All other components within normal limits   No results found.   1. Knee pain, right       MDM  Pt presented due to R leg pain> L leg pain.  Hx of DVT to both leg.  [place] neg for DVT.  INR of 1.7.  No CP, SOB.  Pt is well appearing otherwise.  No edema.  Feel based on history this may be post thrombotic syndrome.  Denies back pain.  No incontinence erported.  Will d/c home.          Nena Alexander, MD 06/08/11 2106

## 2011-06-08 NOTE — ED Notes (Signed)
Pt reports bilateral leg pain x 1 week with right greater then left. Pt is on coumadin for DVTs. Pt reports pain to right knee. No swelling noted to legs. Denies sob. Reports mild dizziness.

## 2011-06-08 NOTE — Progress Notes (Signed)
*  PRELIMINARY RESULTS*  Bilateral lower extremity venous duples has been performed.  No evidence of deep or superficial vein thrombosis noted.  The thrombosis noted in June 2012 in the popliteal veins appears to have resolved.  Sylvia Hernandez 06/08/2011, 5:17 PM

## 2011-06-08 NOTE — Telephone Encounter (Signed)
This encounter was created in error - please disregard.

## 2011-06-08 NOTE — ED Provider Notes (Signed)
I saw and evaluated the patient, reviewed the resident's note and I agree with the findings and plan.  The patient complained of pain in the right posterior thigh and knee.  The exam was unremarkable with good pulses, but complaining of sensory numbness in the thigh.  Ultrasound was negative, will discharge to home.    Geoffery Lyons, MD 06/08/11 [phone]

## 2011-06-11 ENCOUNTER — Ambulatory Visit: Payer: 59 | Admitting: Physical Therapy

## 2011-06-11 ENCOUNTER — Ambulatory Visit (INDEPENDENT_AMBULATORY_CARE_PROVIDER_SITE_OTHER): Payer: 59 | Admitting: Family Medicine

## 2011-06-11 ENCOUNTER — Encounter: Payer: Self-pay | Admitting: Family Medicine

## 2011-06-11 DIAGNOSIS — M79609 Pain in unspecified limb: Secondary | ICD-10-CM

## 2011-06-11 DIAGNOSIS — M5430 Sciatica, unspecified side: Secondary | ICD-10-CM

## 2011-06-11 DIAGNOSIS — M79604 Pain in right leg: Secondary | ICD-10-CM

## 2011-06-11 DIAGNOSIS — M79605 Pain in left leg: Secondary | ICD-10-CM | POA: Insufficient documentation

## 2011-06-11 NOTE — Patient Instructions (Signed)
I would start taking the Alleve for relief of your muscle pain. I am not sure why you started having pain so suddenly.  We will do a few lab tests today. I will call you if they are abnormal, otherwise you will receive the results in the mail.  If you have not heard from [place] in 2 weeks, call and let me know.  For the tingling in your legs, try some of the stretches listed below.   Come back to meet Dr. Edmonia James in 2 weeks, and we can re-evaluate you at that time.    Sciatica with Rehab The sciatic nerve runs from the back down the leg and is responsible for sensation and control of the muscles in the back (posterior) side of the thigh, lower leg, and foot. Sciatica is a condition that is characterized by inflammation of this nerve.   SYMPTOMS    Signs of nerve damage, including numbness and/or weakness along the posterior side of the lower extremity.     Pain in the back of the thigh that may also travel down the leg.     Pain that worsens when sitting for long periods of time.     Occasionally, pain in the back or buttock.  CAUSES   Inflammation of the sciatic nerve is the cause of sciatica. The inflammation is due to something irritating the nerve. Common sources of irritation include:  Sitting for long periods of time.     Direct trauma to the nerve.     Arthritis of the spine.     Herniated or ruptured disk.     Slipping of the vertebrae (spondylolithesis)     Pressure from soft tissues, such as muscles or ligament-like tissue (fascia).  RISK INCREASES WITH:  Sports that place pressure or stress on the spine (football or weightlifting).     Poor strength and flexibility.     Failure to warm-up properly before activity.     Family history of low back pain or disk disorders.     Previous back injury or surgery.     Poor body mechanics, especially when lifting, or poor posture.  PREVENTION    Warm up and stretch properly before activity.     Maintain physical  fitness:     Strength, flexibility, and endurance.     Cardiovascular fitness.     Learn and use proper technique, especially with posture and lifting. When possible, have coach correct improper technique.     Avoid activities that place stress on the spine.  PROGNOSIS If treated properly, then sciatica usually resolves within 6 weeks. However, occasionally surgery is necessary.   RELATED COMPLICATIONS    Permanent nerve damage, including pain, numbness, tingle, or weakness.     Chronic back pain.     Risks of surgery: infection, bleeding, nerve damage, or damage to surrounding tissues.  TREATMENT Treatment initially involves resting from any activities that aggravate your symptoms. The use of ice and medication may help reduce pain and inflammation. The use of strengthening and stretching exercises may help reduce pain with activity. These exercises may be performed at home or with referral to a therapist. A therapist may recommend further treatments, such as transcutaneous electronic nerve stimulation (TENS) or ultrasound. Your caregiver may recommend corticosteroid injections to help reduce inflammation of the sciatic nerve. If symptoms persist despite non-surgical (conservative) treatment, then surgery may be recommended. MEDICATION  If pain medication is necessary, then nonsteroidal anti-inflammatory medications, such as aspirin and ibuprofen, or other  minor pain relievers, such as acetaminophen, are often recommended.     Do not take pain medication for 7 days before surgery.     Prescription pain relievers may be given if deemed necessary by your caregiver. Use only as directed and only as much as you need.     Ointments applied to the skin may be helpful.     Corticosteroid injections may be given by your caregiver. These injections should be reserved for the most serious cases, because they may only be given a certain number of times.  HEAT AND COLD  Cold treatment (icing)  relieves pain and reduces inflammation. Cold treatment should be applied for 10 to 15 minutes every 2 to 3 hours for inflammation and pain and immediately after any activity that aggravates your symptoms. Use ice packs or massage the area with a piece of ice (ice massage).     Heat treatment may be used prior to performing the stretching and strengthening activities prescribed by your caregiver, physical therapist, or athletic trainer. Use a heat pack or soak the injury in warm water.  SEEK MEDICAL CARE IF:  Treatment seems to offer no benefit, or the condition worsens.     Any medications produce adverse side effects.  EXERCISES   RANGE OF MOTION (ROM) AND STRETCHING EXERCISES - Sciatica Most people with sciatic will find that their symptoms worsen with either excessive bending forward (flexion) or arching at the low back (extension). The exercises which will help resolve your symptoms will focus on the opposite motion. Your physician, physical therapist or athletic trainer will help you determine which exercises will be most helpful to resolve your low back pain. Do not complete any exercises without first consulting with your clinician. Discontinue any exercises which worsen your symptoms until you speak to your clinician. If you have pain, numbness or tingling which travels down into your buttocks, leg or foot, the goal of the therapy is for these symptoms to move closer to your back and eventually resolve. Occasionally, these leg symptoms will get better, but your low back pain may worsen; this is typically an indication of progress in your rehabilitation. Be certain to be very alert to any changes in your symptoms and the activities in which you participated in the 24 hours prior to the change. Sharing this information with your clinician will allow him/her to most efficiently treat your condition. These exercises may help you when beginning to rehabilitate your injury. Your symptoms may resolve  with or without further involvement from your physician, physical therapist or athletic trainer. While completing these exercises, remember:    Restoring tissue flexibility helps normal motion to return to the joints. This allows healthier, less painful movement and activity.     An effective stretch should be held for at least 30 seconds.     A stretch should never be painful. You should only feel a gentle lengthening or release in the stretched tissue.  FLEXION RANGE OF MOTION AND STRETCHING EXERCISES: STRETCH - Flexion, Single Knee to Chest   Lie on a firm bed or floor with both legs extended in front of you.     Keeping one leg in contact with the floor, bring your opposite knee to your chest. Hold your leg in place by either grabbing behind your thigh or at your knee.     Pull until you feel a gentle stretch in your low back. Hold __________ seconds.     Slowly release your grasp and repeat the  exercise with the opposite side.  Repeat __________ times. Complete this exercise __________ times per day.   STRETCH - Flexion, Double Knee to Chest  Lie on a firm bed or floor with both legs extended in front of you.     Keeping one leg in contact with the floor, bring your opposite knee to your chest.     Tense your stomach muscles to support your back and then lift your other knee to your chest. Hold your legs in place by either grabbing behind your thighs or at your knees.     Pull both knees toward your chest until you feel a gentle stretch in your low back. Hold __________ seconds.     Tense your stomach muscles and slowly return one leg at a time to the floor.  Repeat __________ times. Complete this exercise __________ times per day.   STRETCH - Low Trunk Rotation   Lie on a firm bed or floor. Keeping your legs in front of you, bend your knees so they are both pointed toward the ceiling and your feet are flat on the floor.     Extend your arms out to the side. This will stabilize  your upper body by keeping your shoulders in contact with the floor.     Gently and slowly drop both knees together to one side until you feel a gentle stretch in your low back. Hold for __________ seconds.     Tense your stomach muscles to support your low back as you bring your knees back to the starting position. Repeat the exercise to the other side.  Repeat __________ times. Complete this exercise __________ times per day   EXTENSION RANGE OF MOTION AND FLEXIBILITY EXERCISES: STRETCH - Extension, Prone on Elbows  Lie on your stomach on the floor, a bed will be too soft. Place your palms about shoulder width apart and at the height of your head.     Place your elbows under your shoulders. If this is too painful, stack pillows under your chest.     Allow your body to relax so that your hips drop lower and make contact more completely with the floor.     Hold this position for __________ seconds.     Slowly return to lying flat on the floor.  Repeat __________ times. Complete this exercise __________ times per day.   RANGE OF MOTION - Extension, Prone Press Ups  Lie on your stomach on the floor, a bed will be too soft. Place your palms about shoulder width apart and at the height of your head.     Keeping your back as relaxed as possible, slowly straighten your elbows while keeping your hips on the floor. You may adjust the placement of your hands to maximize your comfort. As you gain motion, your hands will come more underneath your shoulders.     Hold this position __________ seconds.     Slowly return to lying flat on the floor.  Repeat __________ times. Complete this exercise __________ times per day.   STRENGTHENING EXERCISES - Sciatica  These exercises may help you when beginning to rehabilitate your injury. These exercises should be done near your "sweet spot." This is the neutral, low-back arch, somewhere between fully rounded and fully arched, that is your least painful  position. When performed in this safe range of motion, these exercises can be used for people who have either a flexion or extension based injury. These exercises may resolve your symptoms with  or without further involvement from your physician, physical therapist or athletic trainer. While completing these exercises, remember:    Muscles can gain both the endurance and the strength needed for everyday activities through controlled exercises.     Complete these exercises as instructed by your physician, physical therapist or athletic trainer. Progress with the resistance and repetition exercises only as your caregiver advises.     You may experience muscle soreness or fatigue, but the pain or discomfort you are trying to eliminate should never worsen during these exercises. If this pain does worsen, stop and make certain you are following the directions exactly. If the pain is still present after adjustments, discontinue the exercise until you can discuss the trouble with your clinician.  STRENGTHENING - Deep Abdominals, Pelvic Tilt   Lie on a firm bed or floor. Keeping your legs in front of you, bend your knees so they are both pointed toward the ceiling and your feet are flat on the floor.     Tense your lower abdominal muscles to press your low back into the floor. This motion will rotate your pelvis so that your tail bone is scooping upwards rather than pointing at your feet or into the floor.     With a gentle tension and even breathing, hold this position for __________ seconds.  Repeat __________ times. Complete this exercise __________ times per day.   STRENGTHENING - Abdominals, Crunches   Lie on a firm bed or floor. Keeping your legs in front of you, bend your knees so they are both pointed toward the ceiling and your feet are flat on the floor. Cross your arms over your chest.     Slightly tip your chin down without bending your neck.     Tense your abdominals and slowly lift your  trunk high enough to just clear your shoulder blades. Lifting higher can put excessive stress on the low back and does not further strengthen your abdominal muscles.     Control your return to the starting position.  Repeat __________ times. Complete this exercise __________ times per day.   STRENGTHENING - Quadruped, Opposite UE/LE Lift  Assume a hands and knees position on a firm surface. Keep your hands under your shoulders and your knees under your hips. You may place padding under your knees for comfort.     Find your neutral spine and gently tense your abdominal muscles so that you can maintain this position. Your shoulders and hips should form a rectangle that is parallel with the floor and is not twisted.     Keeping your trunk steady, lift your right hand no higher than your shoulder and then your left leg no higher than your hip. Make sure you are not holding your breath. Hold this position __________ seconds.     Continuing to keep your abdominal muscles tense and your back steady, slowly return to your starting position. Repeat with the opposite arm and leg.  Repeat __________ times. Complete this exercise __________ times per day.   STRENGTHENING - Abdominals and Quadriceps, Straight Leg Raise   Lie on a firm bed or floor with both legs extended in front of you.     Keeping one leg in contact with the floor, bend the other knee so that your foot can rest flat on the floor.     Find your neutral spine, and tense your abdominal muscles to maintain your spinal position throughout the exercise.     Slowly lift your straight leg  off the floor about 6 inches for a count of 15, making sure to not hold your breath.     Still keeping your neutral spine, slowly lower your leg all the way to the floor.  Repeat this exercise with each leg __________ times. Complete this exercise __________ times per day. POSTURE AND BODY MECHANICS CONSIDERATIONS - Sciatica Keeping correct posture when  sitting, standing or completing your activities will reduce the stress put on different body tissues, allowing injured tissues a chance to heal and limiting painful experiences. The following are general guidelines for improved posture. Your physician or physical therapist will provide you with any instructions specific to your needs. While reading these guidelines, remember:  The exercises prescribed by your provider will help you have the flexibility and strength to maintain correct postures.     The correct posture provides the optimal environment for your joints to work. All of your joints have less wear and tear when properly supported by a spine with good posture. This means you will experience a healthier, less painful body.     Correct posture must be practiced with all of your activities, especially prolonged sitting and standing. Correct posture is as important when doing repetitive low-stress activities (typing) as it is when doing a single heavy-load activity (lifting).  RESTING POSITIONS Consider which positions are most painful for you when choosing a resting position. If you have pain with flexion-based activities (sitting, bending, stooping, squatting), choose a position that allows you to rest in a less flexed posture. You would want to avoid curling into a fetal position on your side. If your pain worsens with extension-based activities (prolonged standing, working overhead), avoid resting in an extended position such as sleeping on your stomach. Most people will find more comfort when they rest with their spine in a more neutral position, neither too rounded nor too arched. Lying on a non-sagging bed on your side with a pillow between your knees, or on your back with a pillow under your knees will often provide some relief. Keep in mind, being in any one position for a prolonged period of time, no matter how correct your posture, can still lead to stiffness. PROPER SITTING POSTURE In  order to minimize stress and discomfort on your spine, you must sit with correct posture Sitting with good posture should be effortless for a healthy body. Returning to good posture is a gradual process. Many people can work toward this most comfortably by using various supports until they have the flexibility and strength to maintain this posture on their own. When sitting with proper posture, your ears will fall over your shoulders and your shoulders will fall over your hips. You should use the back of the chair to support your upper back. Your low back will be in a neutral position, just slightly arched. You may place a small pillow or folded towel at the base of your low back for support.   When working at a desk, create an environment that supports good, upright posture. Without extra support, muscles fatigue and lead to excessive strain on joints and other tissues. Keep these recommendations in mind: CHAIR:   A chair should be able to slide under your desk when your back makes contact with the back of the chair. This allows you to work closely.     The chair's height should allow your eyes to be level with the upper part of your monitor and your hands to be slightly lower than your elbows.  BODY POSITION  Your feet should make contact with the floor. If this is not possible, use a foot rest.     Keep your ears over your shoulders. This will reduce stress on your neck and low back.  INCORRECT SITTING POSTURES   If you are feeling tired and unable to assume a healthy sitting posture, do not slouch or slump. This puts excessive strain on your back tissues, causing more damage and pain. Healthier options include:     Using more support, like a lumbar pillow.     Switching tasks to something that requires you to be upright or walking.     Talking a brief walk.     Lying down to rest in a neutral-spine position.  PROLONGED STANDING WHILE SLIGHTLY LEANING FORWARD  When completing a task that  requires you to lean forward while standing in one place for a long time, place either foot up on a stationary 2-4 inch high object to help maintain the best posture. When both feet are on the ground, the low back tends to lose its slight inward curve. If this curve flattens (or becomes too large), then the back and your other joints will experience too much stress, fatigue more quickly and can cause pain.   CORRECT STANDING POSTURES Proper standing posture should be assumed with all daily activities, even if they only take a few moments, like when brushing your teeth. As in sitting, your ears should fall over your shoulders and your shoulders should fall over your hips. You should keep a slight tension in your abdominal muscles to brace your spine. Your tailbone should point down to the ground, not behind your body, resulting in an over-extended swayback posture.   INCORRECT STANDING POSTURES  Common incorrect standing postures include a forward head, locked knees and/or an excessive swayback. WALKING Walk with an upright posture. Your ears, shoulders and hips should all line-up. PROLONGED ACTIVITY IN A FLEXED POSITION When completing a task that requires you to bend forward at your waist or lean over a low surface, try to find a way to stabilize 3 of 4 of your limbs. You can place a hand or elbow on your thigh or rest a knee on the surface you are reaching across. This will provide you more stability so that your muscles do not fatigue as quickly. By keeping your knees relaxed, or slightly bent, you will also reduce stress across your low back. CORRECT LIFTING TECHNIQUES DO :   Assume a wide stance. This will provide you more stability and the opportunity to get as close as possible to the object which you are lifting.     Tense your abdominals to brace your spine; then bend at the knees and hips. Keeping your back locked in a neutral-spine position, lift using your leg muscles. Lift with your legs,  keeping your back straight.     Test the weight of unknown objects before attempting to lift them.     Try to keep your elbows locked down at your sides in order get the best strength from your shoulders when carrying an object.     Always ask for help when lifting heavy or awkward objects.  INCORRECT LIFTING TECHNIQUES DO NOT:   Lock your knees when lifting, even if it is a small object.     Bend and twist. Pivot at your feet or move your feet when needing to change directions.     Assume that you cannot safely pick up a  paperclip without proper posture.  Document Released: 07/16/2005 Document Revised: 03/28/2011 Document Reviewed: 10/28/2008 [facility] Patient Information 2012 [place], [place].

## 2011-06-11 NOTE — Assessment & Plan Note (Signed)
Again, unclear why this would be bilateral. Provided her with stretching exercises to help with this. Please see bilateral pain as far as lab orders.

## 2011-06-11 NOTE — Progress Notes (Signed)
  Subjective:    Patient ID: Sylvia Hernandez, female    DOB: 1971/04/26, 40 y.o.   MRN: [id number]  HPI Bilateral leg pain: This is been present for about one week the patient. She is describing aching pain bilateral hamstrings. Not really feeling any pain in her buttocks. Due to the pain she went to the ER on Friday was prescribed Aleve; however she was concerned this would interact with her Coumadin and therefore she has not taken any.  She also had bilateral venous Dopplers which were negative in the ER.She is also described bilateral paresthesias extending from buttocks down back of leg radiating into calf and the sides of calf and into the top of her feet bilaterally. States all this started the same time. She is worried that she has a blood clot. 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 This was secondary to surgery.   She denies any fevers, chills, generalized weakness. She states that she does have night sweats. She feels these are secondary to oophorectomy which occurred December 2011 due to her BRCA positive status. She does report that her legs have stayed cold for the past week. Review of Systems See HPI above for review of systems.       Objective:   Physical Exam Gen:  Alert, cooperative patient who appears stated age in no acute distress.  Vital signs reviewed. Ext:  No edema noted. Tenderness to palpation bilateral hamstrings. No bruising or redness noted. Skin: No lesions noted, erythema noted throughout Cardiovascular: PT pulse +2 left side. Unable to palpate DP pulses bilaterally. Musculoskeletal full range of motion bilateral knees and hips. Straight leg test positive for hamstring but not back pain.       Assessment & Plan:

## 2011-06-11 NOTE — Assessment & Plan Note (Signed)
Unclear etiology. Most simple explanation would be hamstring strain, though I don't know why this would be bilateral. Plan to check BMET, CBC, ESR today. Return in 2 weeks s/p Alleve for relief

## 2011-06-13 ENCOUNTER — Other Ambulatory Visit: Payer: Self-pay | Admitting: Oncology

## 2011-06-13 ENCOUNTER — Ambulatory Visit: Payer: 59 | Admitting: Physical Therapy

## 2011-06-13 ENCOUNTER — Other Ambulatory Visit (HOSPITAL_BASED_OUTPATIENT_CLINIC_OR_DEPARTMENT_OTHER): Payer: 59 | Admitting: Lab

## 2011-06-13 ENCOUNTER — Telehealth: Payer: Self-pay | Admitting: *Deleted

## 2011-06-13 DIAGNOSIS — Z803 Family history of malignant neoplasm of breast: Secondary | ICD-10-CM

## 2011-06-13 DIAGNOSIS — Z5181 Encounter for therapeutic drug level monitoring: Secondary | ICD-10-CM

## 2011-06-13 DIAGNOSIS — Z7901 Long term (current) use of anticoagulants: Secondary | ICD-10-CM

## 2011-06-13 DIAGNOSIS — I82409 Acute embolism and thrombosis of unspecified deep veins of unspecified lower extremity: Secondary | ICD-10-CM

## 2011-06-13 DIAGNOSIS — I8290 Acute embolism and thrombosis of unspecified vein: Secondary | ICD-10-CM

## 2011-06-13 LAB — PROTIME-INR: INR: 2 (ref 2.00–3.50)

## 2011-06-13 NOTE — Telephone Encounter (Signed)
Per MD,notified pt to continue Coumadin 5mg  M/W/F 2.5mg  other days. Pt verbalized understanding.

## 2011-06-14 ENCOUNTER — Other Ambulatory Visit: Payer: 59

## 2011-06-14 DIAGNOSIS — M79605 Pain in left leg: Secondary | ICD-10-CM

## 2011-06-14 LAB — CBC
HCT: 40.9 % (ref 36.0–46.0)
Hemoglobin: 13.4 g/dL (ref 12.0–15.0)
MCHC: 32.8 g/dL (ref 30.0–36.0)
RBC: 4.66 MIL/uL (ref 3.87–5.11)

## 2011-06-14 LAB — BASIC METABOLIC PANEL
CO2: 28 mEq/L (ref 19–32)
Chloride: 102 mEq/L (ref 96–112)
Glucose, Bld: 94 mg/dL (ref 70–99)
Potassium: 3.8 mEq/L (ref 3.5–5.3)
Sodium: 141 mEq/L (ref 135–145)

## 2011-06-14 LAB — SEDIMENTATION RATE: Sed Rate: 5 mm/hr (ref 0–22)

## 2011-06-14 NOTE — Progress Notes (Signed)
CBC,ESR,BMP AND TSH DONE TODAY Aidin Doane

## 2011-06-15 ENCOUNTER — Ambulatory Visit: Payer: 59 | Admitting: Physical Therapy

## 2011-06-19 ENCOUNTER — Ambulatory Visit: Payer: 59 | Admitting: Physical Therapy

## 2011-06-20 ENCOUNTER — Other Ambulatory Visit (HOSPITAL_BASED_OUTPATIENT_CLINIC_OR_DEPARTMENT_OTHER): Payer: 59 | Admitting: Lab

## 2011-06-20 ENCOUNTER — Other Ambulatory Visit: Payer: Self-pay | Admitting: Oncology

## 2011-06-20 DIAGNOSIS — Z7901 Long term (current) use of anticoagulants: Secondary | ICD-10-CM

## 2011-06-20 DIAGNOSIS — I82409 Acute embolism and thrombosis of unspecified deep veins of unspecified lower extremity: Secondary | ICD-10-CM

## 2011-06-20 LAB — PROTIME-INR: INR: 1.8 — ABNORMAL LOW (ref 2.00–3.50)

## 2011-06-25 ENCOUNTER — Ambulatory Visit: Payer: 59 | Admitting: Physical Therapy

## 2011-06-27 ENCOUNTER — Other Ambulatory Visit: Payer: Self-pay | Admitting: Oncology

## 2011-06-27 ENCOUNTER — Telehealth: Payer: Self-pay | Admitting: *Deleted

## 2011-06-27 ENCOUNTER — Other Ambulatory Visit (HOSPITAL_BASED_OUTPATIENT_CLINIC_OR_DEPARTMENT_OTHER): Payer: 59 | Admitting: Lab

## 2011-06-27 ENCOUNTER — Ambulatory Visit: Payer: 59 | Admitting: Physical Therapy

## 2011-06-27 DIAGNOSIS — I82409 Acute embolism and thrombosis of unspecified deep veins of unspecified lower extremity: Secondary | ICD-10-CM

## 2011-06-27 DIAGNOSIS — Z1501 Genetic susceptibility to malignant neoplasm of breast: Secondary | ICD-10-CM

## 2011-06-27 DIAGNOSIS — Z803 Family history of malignant neoplasm of breast: Secondary | ICD-10-CM

## 2011-06-27 DIAGNOSIS — Z7901 Long term (current) use of anticoagulants: Secondary | ICD-10-CM

## 2011-06-27 LAB — PROTIME-INR
INR: 1.3 — ABNORMAL LOW (ref 2.00–3.50)
Protime: 15.6 Seconds — ABNORMAL HIGH (ref 10.6–13.4)

## 2011-06-27 NOTE — Telephone Encounter (Signed)
Attempted to call pt

## 2011-06-28 ENCOUNTER — Telehealth: Payer: Self-pay | Admitting: *Deleted

## 2011-06-28 ENCOUNTER — Ambulatory Visit: Payer: 59 | Admitting: Physical Therapy

## 2011-06-28 NOTE — Telephone Encounter (Signed)
Per MD, pt is to take coumadin 5mg  daily and recheck 07/02/11

## 2011-06-29 ENCOUNTER — Ambulatory Visit (INDEPENDENT_AMBULATORY_CARE_PROVIDER_SITE_OTHER): Payer: 59 | Admitting: Family Medicine

## 2011-06-29 ENCOUNTER — Encounter: Payer: Self-pay | Admitting: Family Medicine

## 2011-06-29 VITALS — BP 131/82 | HR 75 | Ht 62.0 in | Wt 140.6 lb

## 2011-06-29 DIAGNOSIS — M79605 Pain in left leg: Secondary | ICD-10-CM

## 2011-06-29 DIAGNOSIS — M79609 Pain in unspecified limb: Secondary | ICD-10-CM

## 2011-06-29 NOTE — Progress Notes (Signed)
  Subjective:    Patient ID: Sylvia Hernandez, female    DOB: [birth year], 40 y.o.   MRN: [id number]  HPI  Bilateral leg pain: Patient here for follow-up of bilateral leg pain and numbness which she presented to the clinic for 2 weeks ago. Presented to the ER first because she was concerned for DVT. Had doppler US done there which was negative. Was told to try Aleve and sciatica exercises. States she did try sciatica exercises which did not help at the time. Patient states both the pain and numbness resolved 1 week ago without any intervention. She is no longer exhibiting any symptoms.  Currently on Coumadin which is followed by her Oncologist.   Spoke with patient about need for routine physical care. States she receives her annual physicals with Ob/Gyn.   Patient would like flu shot today.  Review of Systems C/O of chest pain but states this is on-going after her mastectomy.  Denies any fever, chills, shortness of breath, nausea, vomiting or diarrhea.    Objective:   Physical Exam  General: Well-appearing, NAD Cardiovascular: RRR, no murmurs/rubs/gallops Pulm: CTA bilaterally, no wheezes/rhonci/rales MSKL: 5/5 strength in bilateral lower extremities, sensation fully in tact in bilateral lower extremities  Negative Homan's sign. Negative Straight Leg Raise test Extrenities: Well perfused. Warm to touch. No tenderness to palpation. No edema or cyanosis.      Assessment & Plan:  1. Bilateral leg pain, unknown origin.  Resolved. Patient had CBC, Bmet and ESR done at last visit. All WNL. Come back in for further concerns or repeat symptoms.

## 2011-06-29 NOTE — Assessment & Plan Note (Signed)
Resolved- see note

## 2011-07-02 ENCOUNTER — Other Ambulatory Visit: Payer: Self-pay | Admitting: Oncology

## 2011-07-02 ENCOUNTER — Telehealth: Payer: Self-pay | Admitting: Oncology

## 2011-07-02 ENCOUNTER — Ambulatory Visit (HOSPITAL_BASED_OUTPATIENT_CLINIC_OR_DEPARTMENT_OTHER): Payer: 59 | Admitting: Oncology

## 2011-07-02 ENCOUNTER — Ambulatory Visit: Payer: 59 | Attending: Plastic Surgery | Admitting: Physical Therapy

## 2011-07-02 ENCOUNTER — Other Ambulatory Visit (HOSPITAL_BASED_OUTPATIENT_CLINIC_OR_DEPARTMENT_OTHER): Payer: 59 | Admitting: Lab

## 2011-07-02 VITALS — BP 121/84 | HR 77 | Temp 98.4°F | Ht 62.0 in | Wt 141.4 lb

## 2011-07-02 DIAGNOSIS — IMO0001 Reserved for inherently not codable concepts without codable children: Secondary | ICD-10-CM | POA: Insufficient documentation

## 2011-07-02 DIAGNOSIS — I82409 Acute embolism and thrombosis of unspecified deep veins of unspecified lower extremity: Secondary | ICD-10-CM

## 2011-07-02 DIAGNOSIS — M25619 Stiffness of unspecified shoulder, not elsewhere classified: Secondary | ICD-10-CM | POA: Insufficient documentation

## 2011-07-02 DIAGNOSIS — M25519 Pain in unspecified shoulder: Secondary | ICD-10-CM | POA: Insufficient documentation

## 2011-07-02 DIAGNOSIS — I2699 Other pulmonary embolism without acute cor pulmonale: Secondary | ICD-10-CM

## 2011-07-02 DIAGNOSIS — Z1501 Genetic susceptibility to malignant neoplasm of breast: Secondary | ICD-10-CM

## 2011-07-02 DIAGNOSIS — Z7901 Long term (current) use of anticoagulants: Secondary | ICD-10-CM

## 2011-07-02 DIAGNOSIS — M24519 Contracture, unspecified shoulder: Secondary | ICD-10-CM | POA: Insufficient documentation

## 2011-07-02 DIAGNOSIS — Z9010 Acquired absence of unspecified breast and nipple: Secondary | ICD-10-CM

## 2011-07-02 LAB — CBC WITH DIFFERENTIAL/PLATELET
BASO%: 0.3 % (ref 0.0–2.0)
HCT: 39.5 % (ref 34.8–46.6)
LYMPH%: 36.7 % (ref 14.0–49.7)
MCH: 29.1 pg (ref 25.1–34.0)
MCHC: 33.9 g/dL (ref 31.5–36.0)
MCV: 85.7 fL (ref 79.5–101.0)
MONO#: 0.4 10*3/uL (ref 0.1–0.9)
MONO%: 7 % (ref 0.0–14.0)
NEUT%: 54.1 % (ref 38.4–76.8)
Platelets: 216 10*3/uL (ref 145–400)
RBC: 4.61 10*6/uL (ref 3.70–5.45)
WBC: 5.7 10*3/uL (ref 3.9–10.3)

## 2011-07-02 LAB — PROTIME-INR: Protime: 21.6 Seconds — ABNORMAL HIGH (ref 10.6–13.4)

## 2011-07-02 NOTE — Progress Notes (Signed)
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  PRIOR THERAPY:  #1 patient is status post bilateral salpingo-oophorectomy performed in December 2011 with the final pathology revealing no evidence of ovarian malignancy. This was a risk reducing surgery.  #2 patient is status post bilateral prophylactic mastectomies with immediate reconstruction.  #3 patient and developed thrombosis of the lower extremities and pulmonary embolism secondary to her pelvic surgery.  anticoagulated with Coumadin trying to keep her INR between 2 and 2.5. Plan is to have her anticoagulated for a total of 6 months.   CURRENT THERAPY: Coumadin 5 mg daily today INR was 1.8.   INTERVAL HISTORY: Sylvia Hernandez 40 y.o. female returns for followup visit. Overall she seems to be doing well she is without any complaints. She seems to be anticoagulated on 5 mg. Although today her INR is 1.8 but we had made to change to 5 mg daily last Thursday. She has not had any bleeding problems whatsoever. She denies any fevers chills night sweats headaches shortness of breath chest pains palpitations no myalgias or arthralgias no hematuria hematochezia melena hemoptysis or hematemesis. Remainder of the 10 point review of systems is negative.   MEDICAL HISTORY: Past Medical History  Diagnosis Date  . Asthma   . Anxiety   . GERD (gastroesophageal reflux disease)   . Migraine   . Allergy   . FHx: BRCA2 gene positive   . History of blood clots     ALLERGIES:  is allergic to codeine; ibuprofen; and shellfish allergy.  MEDICATIONS:  Current Outpatient Prescriptions  Medication Sig Dispense Refill  . albuterol (VENTOLIN HFA) 108 (90 BASE) MCG/ACT inhaler Inhale 2 puffs into the  lungs daily as needed.       . warfarin (COUMADIN) 5 MG tablet Take 5 mg by mouth daily. Monday,wednesday,and Friday 5mg .  All other days 2.5mg .        SURGICAL HISTORY:  Past Surgical History  Procedure Date  . Cesarean section   . Abdominal hysterectomy   . Tympanomastoidectomy   . Ovary surgery   . Breast surgery     bil masty    REVIEW OF SYSTEMS:  Pertinent items are noted in HPI.   PHYSICAL EXAMINATION: General appearance: alert, cooperative and appears stated age Head: Normocephalic, without obvious abnormality, atraumatic Neck: no adenopathy, no carotid bruit, no JVD, supple, symmetrical, trachea midline and thyroid not enlarged, symmetric, no tenderness/mass/nodules Resp: clear to auscultation bilaterally and normal percussion bilaterally Back: symmetric, no curvature. ROM normal. No CVA tenderness. Cardio: regular rate and rhythm, S1, S2 normal, no murmur, click, rub or gallop and normal apical impulse GI: soft, non-tender; bowel sounds normal; no masses,  no organomegaly Extremities: extremities normal, atraumatic, no cyanosis or edema Neurologic: Alert and oriented X 3, normal strength and tone. Normal symmetric reflexes. Normal coordination and gait  ECOG PERFORMANCE STATUS: 0 - Asymptomatic  Blood pressure 121/84, pulse 77, temperature 98.4 F (36.9 C), temperature source Oral, height 5\' 2"  (1.575 m), weight 141 lb 6.4 oz (64.139 kg).  LABORATORY DATA: Lab Results  Component Value Date   WBC 5.7 07/02/2011   HGB 13.4 07/02/2011   HCT 39.5 07/02/2011   MCV 85.7 07/02/2011   PLT 216 07/02/2011      Chemistry  Component Value Date/Time   NA 141 06/14/2011 0928   K 3.8 06/14/2011 0928   CL 102 06/14/2011 0928   CO2 28 06/14/2011 0928   BUN 20 06/14/2011 0928   CREATININE 1.00 06/14/2011 0928   CREATININE 0.88 02/09/2011 1427      Component Value Date/Time   CALCIUM 10.0 06/14/2011 0928   ALKPHOS 79 01/11/2011 0918   AST 17 01/11/2011 0918   ALT 16  01/11/2011 0918   BILITOT 0.2* 01/11/2011 0918       RADIOGRAPHIC STUDIES:  No results found.  ASSESSMENT:  40 year old female who is BRCA2 carrier for the BRCA mutation. She is status post prophylactic risk reducing surgeries including bilateral salpingo-oophorectomies performed December 2011. She subsequently in the summer of this year had  Prophylactic bilateral mastectomies performed. Unfortunately her course was complicated develop by development of thrombosis of the lower extremities and pulmonary embolism. Most likely felt to be due to her pelvic surgery. She is now on anticoagulation with Coumadin with trying to keep her INR between 2 and 2.overall she has done quite well. 5.   PLAN:  we will continue the Coumadin dose at 5 mg daily. She had a will have another INR checked in one week's time. Plan is to get her off of anticoagulation after 6 months of full anticoagulation which will be a bout end of January.    All questions were answered. The patient knows to call the clinic with any problems, questions or concerns. We can certainly see the patient much sooner if necessary.  I spent 20 minutes counseling the patient face to face. The total time spent in the appointment was 30 minutes.    Sylvia Second, MD Medical/Oncology [facility] [phone] (beeper) [phone] (Office)  07/02/2011, 6:02 PM

## 2011-07-02 NOTE — Progress Notes (Signed)
Per MD, pt is to remain on 5mg  of coumadin daily

## 2011-07-02 NOTE — Telephone Encounter (Signed)
Gv pt appt for dec-jan2013 ° °

## 2011-07-04 ENCOUNTER — Ambulatory Visit: Payer: 59 | Admitting: Physical Therapy

## 2011-07-04 ENCOUNTER — Telehealth: Payer: Self-pay | Admitting: *Deleted

## 2011-07-04 ENCOUNTER — Other Ambulatory Visit (HOSPITAL_BASED_OUTPATIENT_CLINIC_OR_DEPARTMENT_OTHER): Payer: 59

## 2011-07-04 ENCOUNTER — Other Ambulatory Visit: Payer: Self-pay | Admitting: Oncology

## 2011-07-04 DIAGNOSIS — Z7901 Long term (current) use of anticoagulants: Secondary | ICD-10-CM

## 2011-07-04 DIAGNOSIS — I8290 Acute embolism and thrombosis of unspecified vein: Secondary | ICD-10-CM

## 2011-07-04 DIAGNOSIS — Z803 Family history of malignant neoplasm of breast: Secondary | ICD-10-CM

## 2011-07-04 DIAGNOSIS — Z1501 Genetic susceptibility to malignant neoplasm of breast: Secondary | ICD-10-CM

## 2011-07-04 DIAGNOSIS — I82409 Acute embolism and thrombosis of unspecified deep veins of unspecified lower extremity: Secondary | ICD-10-CM

## 2011-07-04 LAB — PROTIME-INR

## 2011-07-04 NOTE — Telephone Encounter (Signed)
Per MD, pt to continue Coumadin 5mg  daily recheck labs as scheduled. Pt verbalized understanding.

## 2011-07-06 ENCOUNTER — Encounter: Payer: 59 | Admitting: Physical Therapy

## 2011-07-07 IMAGING — CT CT ANGIO CHEST
2 series · 19 of 30 positions shown · IV contrast (agent unspecified)
Comparison: None.

CLINICAL DATA: Syncopal episode; preventive mastectomy and
reconstruction in December 2010

 CT ANGIOGRAPHY CHEST WITH CONTRAST
TECHNIQUE: Multidetector CT imaging of the chest was performed
using the standard protocol during bolus administration of
intravenous contrast.  Multiplanar CT image reconstructions
including MIPs were obtained to evaluate the vascular anatomy.
Contrast:  70 ml Omni 300

[Series 2: routine chest 5.0 st · axial · 0.88mm/px · z∈[-280,-40]mm · 14 of 57 slices shown]
[im 5/57  lung]
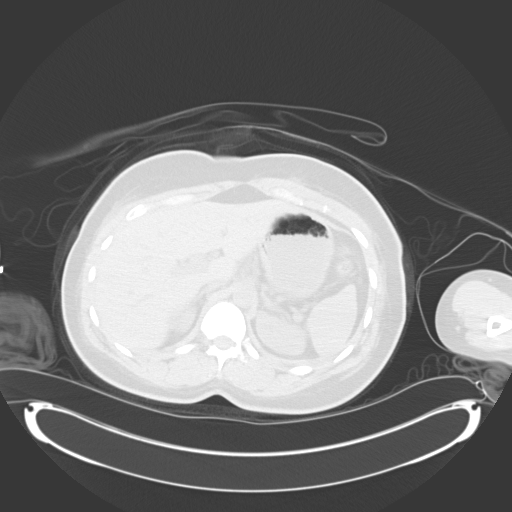
[im 9/57  mediastinal]
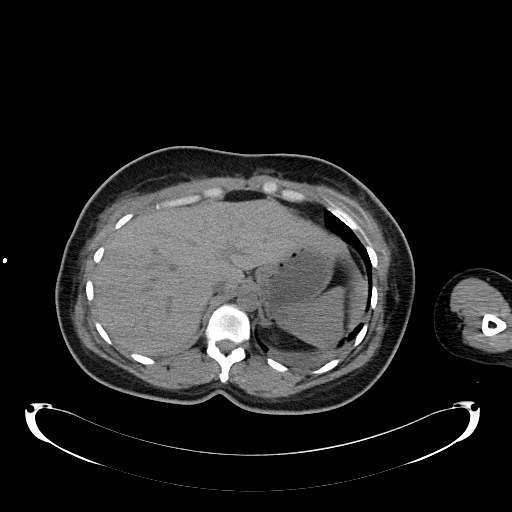
[im 13/57  lung]
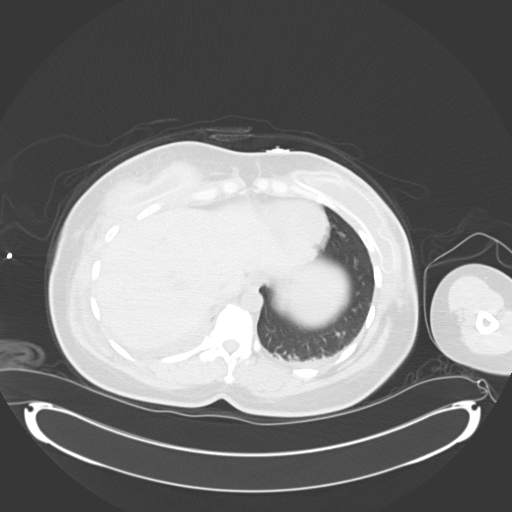
[im 17/57  mediastinal]
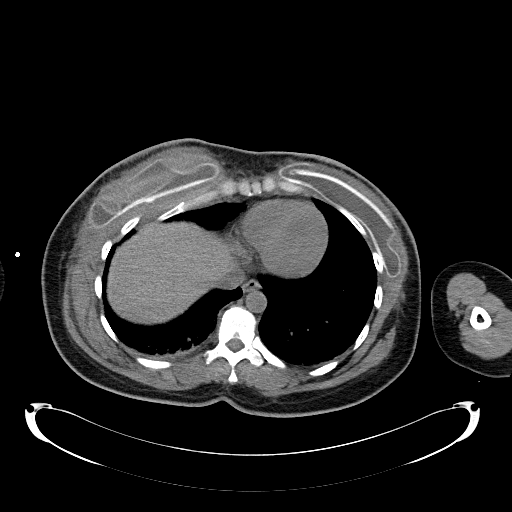
[im 21/57  lung]
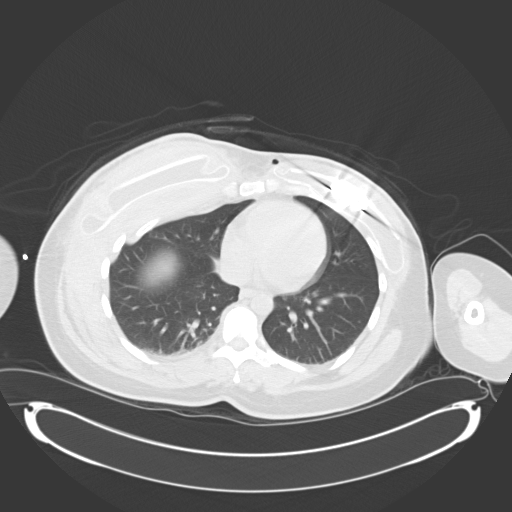
[im 25/57  mediastinal]
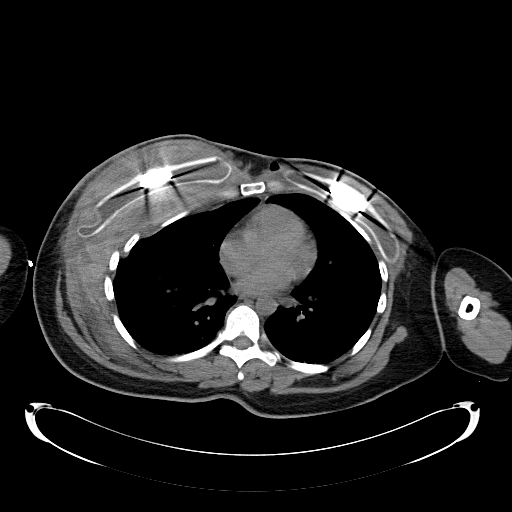
[im 27/57  lung]
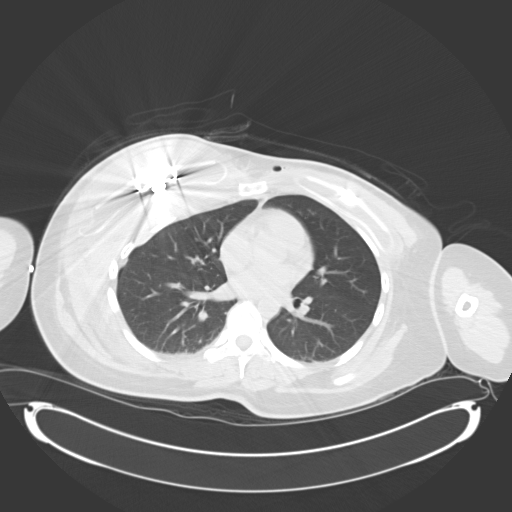
[im 29/57  mediastinal]
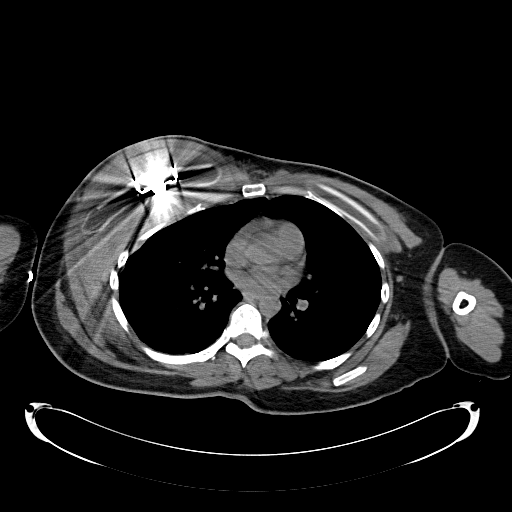
[im 33/57  lung]
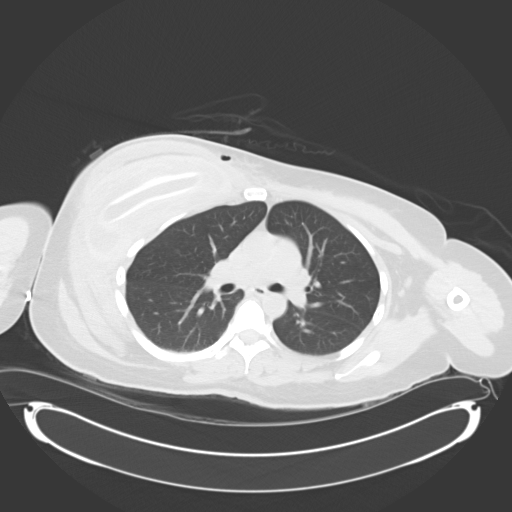
[im 37/57  mediastinal]
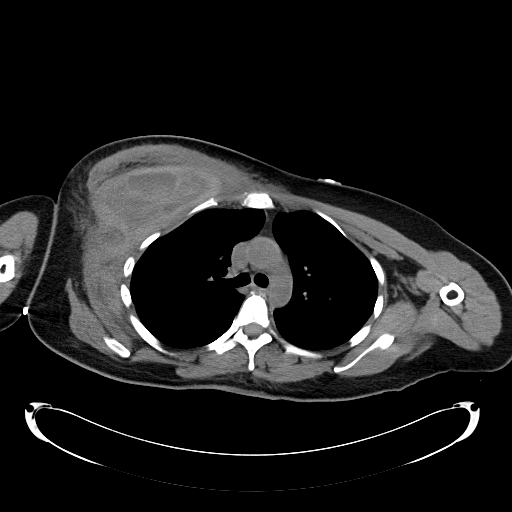
[im 41/57  lung]
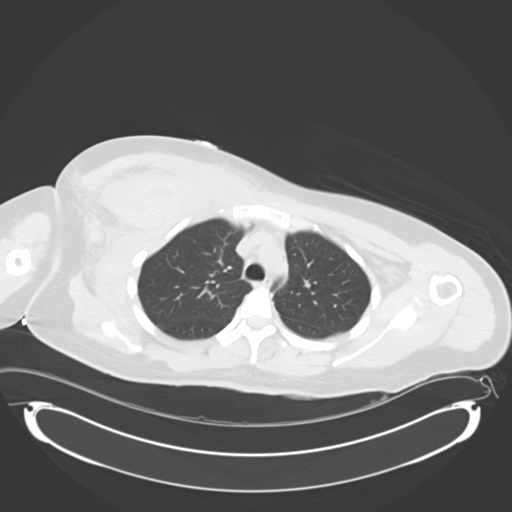
[im 45/57  mediastinal]
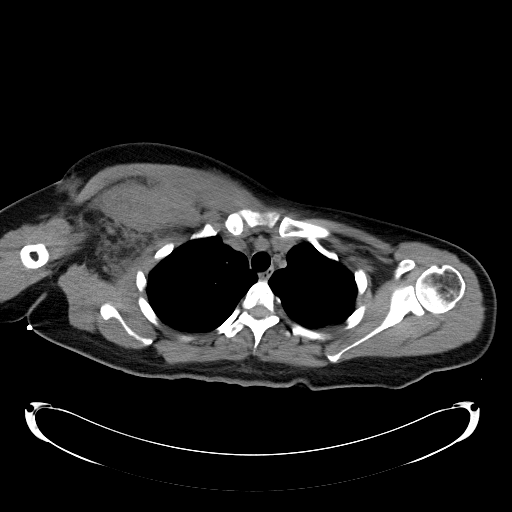
[im 49/57  lung]
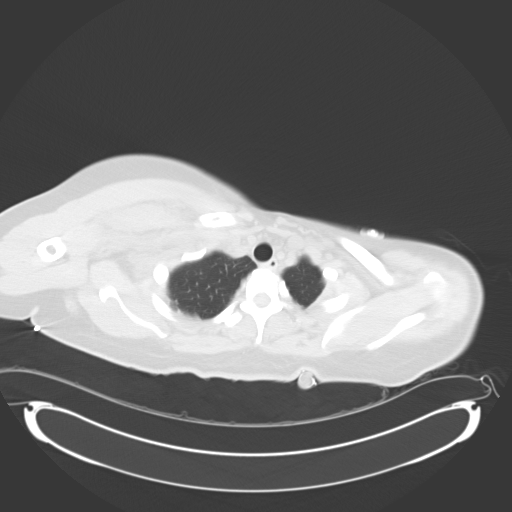
[im 53/57  mediastinal]
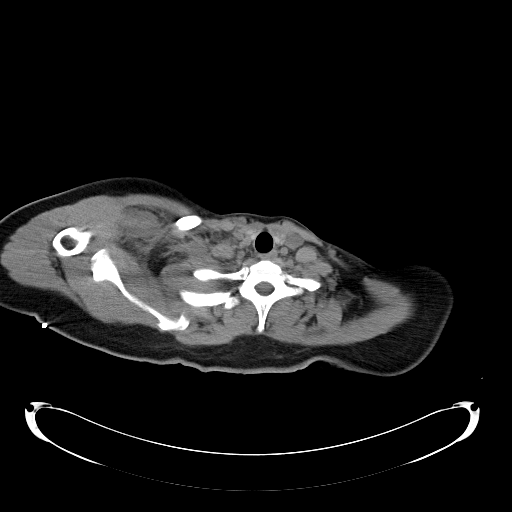

[Series 3: routine chest 5.0 lung · axial · 0.88mm/px · z∈[-244,-144]mm · 5 of 50 slices shown]
[im 5/50  mediastinal]
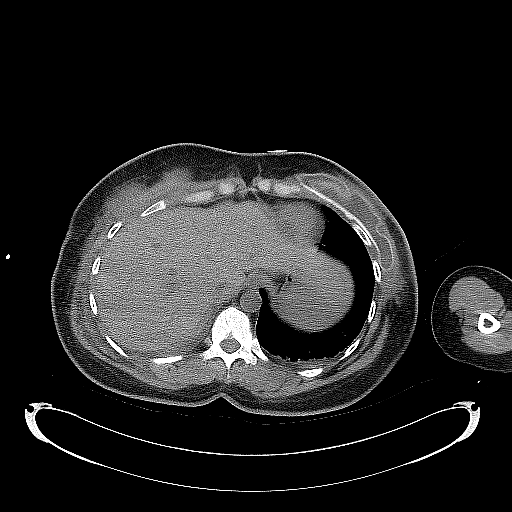
[im 13/50  mediastinal]
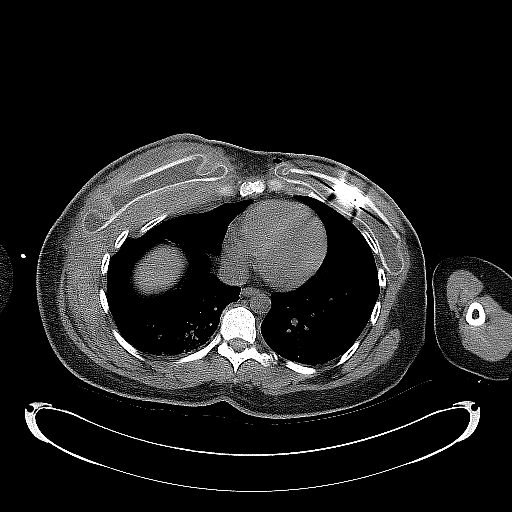
[im 17/50  mediastinal]
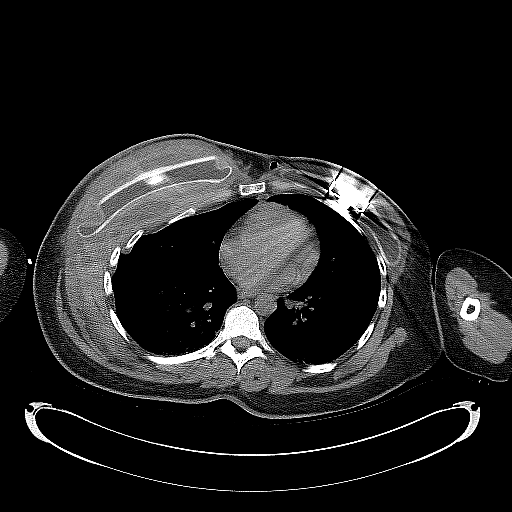
[im 21/50  mediastinal]
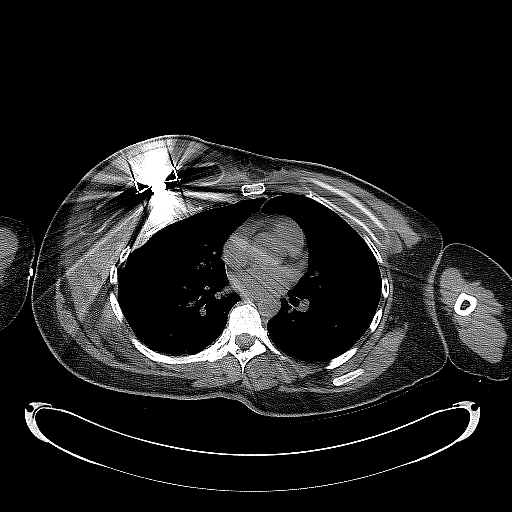
[im 25/50  mediastinal]
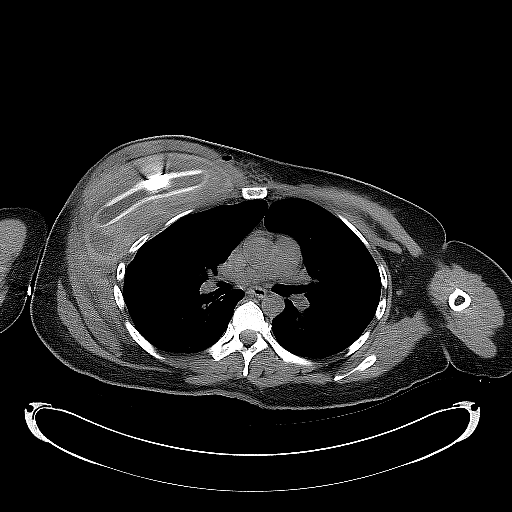

[19 of 30 positions shown; findings below may reference images not displayed]

FINDINGS: There are no filling defects within either pulmonary
arterial system to suggest a segmental or subsegmental pulmonary
embolus.  The thoracic aorta has a normal caliber and appearance.

Bilateral breast implants are noted.  There is a subacute
hematoma/seroma formation over the right chest wall surrounding the
implant and extending superiorly to just below the right
clavicle.Postoperative gas is noted medial to the left breast
implant.

Both lungs are clear except for mild bibasilar atelectasis.  There
is no axillary, hilar, mediastinal adenopathy.  The right chest
wall fluid collection extends toward the right axilla.   The
visualized upper abdomen and osseous structures are unremarkable.

Review of the MIP images confirms the above findings.
IMPRESSION: There is no evidence of pulmonary embolus.

There is a large postoperative subacute hematoma/seroma surrounding
the right breast implant extending towards the right axilla.

## 2011-07-09 ENCOUNTER — Ambulatory Visit: Payer: 59 | Admitting: Physical Therapy

## 2011-07-09 ENCOUNTER — Other Ambulatory Visit: Payer: Self-pay | Admitting: Oncology

## 2011-07-09 DIAGNOSIS — I824Z9 Acute embolism and thrombosis of unspecified deep veins of unspecified distal lower extremity: Secondary | ICD-10-CM

## 2011-07-11 ENCOUNTER — Other Ambulatory Visit: Payer: 59 | Admitting: Lab

## 2011-07-11 ENCOUNTER — Encounter: Payer: 59 | Admitting: Physical Therapy

## 2011-07-12 ENCOUNTER — Encounter: Payer: 59 | Admitting: Physical Therapy

## 2011-07-17 ENCOUNTER — Ambulatory Visit: Payer: 59 | Admitting: Physical Therapy

## 2011-07-18 ENCOUNTER — Other Ambulatory Visit (HOSPITAL_BASED_OUTPATIENT_CLINIC_OR_DEPARTMENT_OTHER): Payer: 59 | Admitting: Lab

## 2011-07-18 ENCOUNTER — Ambulatory Visit: Payer: 59 | Admitting: Physical Therapy

## 2011-07-18 DIAGNOSIS — I2699 Other pulmonary embolism without acute cor pulmonale: Secondary | ICD-10-CM

## 2011-07-18 LAB — PROTIME-INR

## 2011-07-19 ENCOUNTER — Telehealth: Payer: Self-pay | Admitting: *Deleted

## 2011-07-19 ENCOUNTER — Ambulatory Visit: Payer: 59 | Admitting: Physical Therapy

## 2011-07-19 NOTE — Telephone Encounter (Signed)
Pt notified to continue same Coumadin dose. Pt verbalized understanding.

## 2011-07-25 ENCOUNTER — Ambulatory Visit: Payer: 59 | Admitting: Physical Therapy

## 2011-07-25 ENCOUNTER — Other Ambulatory Visit (HOSPITAL_BASED_OUTPATIENT_CLINIC_OR_DEPARTMENT_OTHER): Payer: 59 | Admitting: Lab

## 2011-07-25 DIAGNOSIS — I2699 Other pulmonary embolism without acute cor pulmonale: Secondary | ICD-10-CM

## 2011-07-25 LAB — PROTIME-INR
INR: 2 (ref 2.00–3.50)
Protime: 24 Seconds — ABNORMAL HIGH (ref 10.6–13.4)

## 2011-07-27 ENCOUNTER — Ambulatory Visit: Payer: 59 | Admitting: Physical Therapy

## 2011-07-30 ENCOUNTER — Ambulatory Visit: Payer: 59 | Admitting: Physical Therapy

## 2011-07-30 ENCOUNTER — Telehealth: Payer: Self-pay | Admitting: *Deleted

## 2011-07-30 NOTE — Telephone Encounter (Signed)
Pt.notified

## 2011-07-30 NOTE — Telephone Encounter (Signed)
Message copied by Cooper Render on Mon Jul 30, 2011  2:22 PM ------      Message from: Victorino December      Created: Mon Jul 30, 2011  1:35 PM       Call patient: let pt know keep same dose of coumadin and recheck as scheduled

## 2011-08-01 ENCOUNTER — Ambulatory Visit: Payer: 59 | Attending: Plastic Surgery | Admitting: Physical Therapy

## 2011-08-01 ENCOUNTER — Other Ambulatory Visit (HOSPITAL_BASED_OUTPATIENT_CLINIC_OR_DEPARTMENT_OTHER): Payer: 59 | Admitting: Lab

## 2011-08-01 DIAGNOSIS — M25519 Pain in unspecified shoulder: Secondary | ICD-10-CM | POA: Insufficient documentation

## 2011-08-01 DIAGNOSIS — M25619 Stiffness of unspecified shoulder, not elsewhere classified: Secondary | ICD-10-CM | POA: Insufficient documentation

## 2011-08-01 DIAGNOSIS — IMO0001 Reserved for inherently not codable concepts without codable children: Secondary | ICD-10-CM | POA: Insufficient documentation

## 2011-08-01 DIAGNOSIS — I2699 Other pulmonary embolism without acute cor pulmonale: Secondary | ICD-10-CM

## 2011-08-01 DIAGNOSIS — M24519 Contracture, unspecified shoulder: Secondary | ICD-10-CM | POA: Insufficient documentation

## 2011-08-01 LAB — PROTIME-INR: Protime: 25.2 Seconds — ABNORMAL HIGH (ref 10.6–13.4)

## 2011-08-02 ENCOUNTER — Ambulatory Visit: Payer: 59 | Admitting: Physical Therapy

## 2011-08-03 ENCOUNTER — Encounter: Payer: 59 | Admitting: Physical Therapy

## 2011-08-06 ENCOUNTER — Ambulatory Visit: Payer: 59 | Admitting: Physical Therapy

## 2011-08-08 ENCOUNTER — Encounter: Payer: 59 | Admitting: Physical Therapy

## 2011-08-08 ENCOUNTER — Other Ambulatory Visit: Payer: 59 | Admitting: Lab

## 2011-08-10 ENCOUNTER — Ambulatory Visit: Payer: 59 | Admitting: Physical Therapy

## 2011-08-13 ENCOUNTER — Ambulatory Visit: Payer: 59 | Admitting: Physical Therapy

## 2011-08-15 ENCOUNTER — Other Ambulatory Visit (HOSPITAL_BASED_OUTPATIENT_CLINIC_OR_DEPARTMENT_OTHER): Payer: 59 | Admitting: Lab

## 2011-08-15 ENCOUNTER — Ambulatory Visit: Payer: 59 | Admitting: Physical Therapy

## 2011-08-15 ENCOUNTER — Encounter: Payer: Self-pay | Admitting: *Deleted

## 2011-08-15 DIAGNOSIS — Z7901 Long term (current) use of anticoagulants: Secondary | ICD-10-CM

## 2011-08-15 DIAGNOSIS — I82409 Acute embolism and thrombosis of unspecified deep veins of unspecified lower extremity: Secondary | ICD-10-CM

## 2011-08-15 DIAGNOSIS — I8290 Acute embolism and thrombosis of unspecified vein: Secondary | ICD-10-CM

## 2011-08-15 DIAGNOSIS — Z1501 Genetic susceptibility to malignant neoplasm of breast: Secondary | ICD-10-CM

## 2011-08-15 DIAGNOSIS — Z803 Family history of malignant neoplasm of breast: Secondary | ICD-10-CM

## 2011-08-15 LAB — PROTIME-INR: INR: 2.6 (ref 2.00–3.50)

## 2011-08-15 NOTE — Progress Notes (Signed)
Notified pt, per MD, to present dose of coumadin mg daily will repeat labs 08/22/11

## 2011-08-17 ENCOUNTER — Ambulatory Visit: Payer: 59 | Admitting: Physical Therapy

## 2011-08-20 ENCOUNTER — Encounter: Payer: 59 | Admitting: Physical Therapy

## 2011-08-22 ENCOUNTER — Other Ambulatory Visit (HOSPITAL_BASED_OUTPATIENT_CLINIC_OR_DEPARTMENT_OTHER): Payer: 59 | Admitting: Lab

## 2011-08-22 ENCOUNTER — Telehealth: Payer: Self-pay | Admitting: Oncology

## 2011-08-22 ENCOUNTER — Encounter: Payer: Self-pay | Admitting: Oncology

## 2011-08-22 ENCOUNTER — Encounter: Payer: 59 | Admitting: Physical Therapy

## 2011-08-22 ENCOUNTER — Ambulatory Visit (HOSPITAL_BASED_OUTPATIENT_CLINIC_OR_DEPARTMENT_OTHER): Payer: 59 | Admitting: Oncology

## 2011-08-22 VITALS — BP 118/80 | HR 80 | Temp 98.5°F | Ht 62.0 in | Wt 144.9 lb

## 2011-08-22 DIAGNOSIS — Z9079 Acquired absence of other genital organ(s): Secondary | ICD-10-CM

## 2011-08-22 DIAGNOSIS — I2699 Other pulmonary embolism without acute cor pulmonale: Secondary | ICD-10-CM

## 2011-08-22 DIAGNOSIS — Z1501 Genetic susceptibility to malignant neoplasm of breast: Secondary | ICD-10-CM

## 2011-08-22 DIAGNOSIS — I82819 Embolism and thrombosis of superficial veins of unspecified lower extremities: Secondary | ICD-10-CM

## 2011-08-22 LAB — PROTIME-INR: INR: 3 (ref 2.00–3.50)

## 2011-08-22 NOTE — Telephone Encounter (Signed)
gve the pt her jan 2014 appt calendar °

## 2011-08-22 NOTE — Progress Notes (Signed)
OFFICE PROGRESS NOTE    Sylvia Mayhew, MD, MD 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  PRIOR THERAPY:  #1 patient is status post bilateral salpingo-oophorectomy performed in December 2011 with the final pathology revealing no evidence of ovarian malignancy. This was a risk reducing surgery.  #2 patient is status post bilateral prophylactic mastectomies with immediate reconstruction.  #3 patient and developed thrombosis of the lower extremities and pulmonary embolism secondary to her pelvic surgery.  anticoagulated with Coumadin trying to keep her INR between 2 and 2.5. Plan is to have her anticoagulated for a total of 6 months.   #4 patient has now completed 6 months of anticoagulation therapy and she has been recommended to discontinue Coumadin  CURRENT THERAPY: Observation  INTERVAL HISTORY: Sylvia Hernandez 41 y.o. female returns for followup visit.  Clinically she does doing well. She is now coming to and of anticoagulation. She has completed 6 months of Coumadin therapy for her lower extremity DVT and pulmonary embolism secondary to her pelvic surgery. She overall has done well. Today she feels well she has no fevers chills night sweats headaches shortness of breath chest pains palpitations no swelling in her legs. Remainder of the 10 point review of systems is negative.  MEDICAL HISTORY: Past Medical History  Diagnosis Date  . Asthma   . Anxiety   . GERD (gastroesophageal reflux disease)   . Migraine   . Allergy   . FHx: BRCA2 gene positive   . History of blood clots     ALLERGIES:  is allergic to codeine; ibuprofen; and shellfish allergy.  MEDICATIONS:  Current Outpatient Prescriptions  Medication Sig Dispense Refill  . albuterol (VENTOLIN HFA) 108 (90  BASE) MCG/ACT inhaler Inhale 2 puffs into the lungs daily as needed.       . warfarin (COUMADIN) 5 MG tablet TAKE 1 TABLET BY MOUTH EVERY DAY OR AS DIRECTED  30 tablet  4    SURGICAL HISTORY:  Past Surgical History  Procedure Date  . Cesarean section   . Abdominal hysterectomy   . Tympanomastoidectomy   . Ovary surgery   . Breast surgery     bil masty    REVIEW OF SYSTEMS:  Pertinent items are noted in HPI.   PHYSICAL EXAMINATION: General appearance: alert, cooperative and appears stated age Head: Normocephalic, without obvious abnormality, atraumatic Neck: no adenopathy, no carotid bruit, no JVD, supple, symmetrical, trachea midline and thyroid not enlarged, symmetric, no tenderness/mass/nodules Resp: clear to auscultation bilaterally and normal percussion bilaterally Back: symmetric, no curvature. ROM normal. No CVA tenderness. Cardio: regular rate and rhythm, S1, S2 normal, no murmur, click, rub or gallop and normal apical impulse GI: soft, non-tender; bowel sounds normal; no masses,  no organomegaly Extremities: extremities normal, atraumatic, no cyanosis or edema Neurologic: Alert and oriented X 3, normal strength and tone. Normal symmetric reflexes. Normal coordination and gait  ECOG PERFORMANCE STATUS: 0 - Asymptomatic  Blood pressure 118/80, pulse 80, temperature 98.5 F (36.9 C), height 5\' 2"  (1.575 m), weight 144 lb 14.4 oz (65.726 kg).  LABORATORY DATA: Lab Results  Component Value Date   WBC 5.7 07/02/2011   HGB 13.4 07/02/2011   HCT 39.5 07/02/2011   MCV 85.7 07/02/2011   PLT 216 07/02/2011      Chemistry      Component  Value Date/Time   NA 141 06/14/2011 0928   K 3.8 06/14/2011 0928   CL 102 06/14/2011 0928   CO2 28 06/14/2011 0928   BUN 20 06/14/2011 0928   CREATININE 1.00 06/14/2011 0928   CREATININE 0.88 02/09/2011 1427      Component Value Date/Time   CALCIUM 10.0 06/14/2011 0928   ALKPHOS 79 01/11/2011 0918   AST 17 01/11/2011 0918   ALT 16  01/11/2011 0918   BILITOT 0.2* 01/11/2011 0918       RADIOGRAPHIC STUDIES:  No results found.  ASSESSMENT:  41 year old female who is BRCA2 carrier for the BRCA mutation. She is status post prophylactic risk reducing surgeries including bilateral salpingo-oophorectomies performed December 2011. She subsequently in the summer of this year had  Prophylactic bilateral mastectomies performed. Unfortunately her course was complicated develop by development of thrombosis of the lower extremities and pulmonary embolism. Most likely felt to be due to her pelvic surgery. Patient has now completed 6 months of anticoagulation with Coumadin. Throughout her course her Coumadin INR remain very therapeutic and very compliant. She has done very well.  PLAN:   #1 we will discontinue the Coumadin. And she will go on observation from anticoagulation perspective.  #2 patient is a BRCA2 mutation carrier. She is now status post bilateral mastectomies as well as bilateral salpingo-oophorectomies and hysterectomy. Overall she is doing well from this perspective her  #3 patient and I discussed the follow up for her. She at this time would like to continue to be seen in the high risk clinic on a yearly basis. I have gone ahead and make an appointment for her to be seen in our high-risk clinic by Colman Cater my nurse practitioner and specialist in high-risk.  All questions were answered. The patient knows to call the clinic with any problems, questions or concerns. We can certainly see the patient much sooner if necessary.  I spent 20 minutes counseling the patient face to face. The total time spent in the appointment was 30 minutes.    Drue Second, MD Medical/Oncology [facility] [phone] (beeper) [phone] (Office)  08/22/2011, 1:15 PM

## 2011-08-23 ENCOUNTER — Ambulatory Visit: Payer: 59 | Admitting: Physical Therapy

## 2011-08-23 ENCOUNTER — Encounter (INDEPENDENT_AMBULATORY_CARE_PROVIDER_SITE_OTHER): Payer: Self-pay | Admitting: General Surgery

## 2011-08-24 ENCOUNTER — Ambulatory Visit: Payer: 59 | Admitting: Physical Therapy

## 2011-08-28 ENCOUNTER — Ambulatory Visit: Payer: 59 | Admitting: Physical Therapy

## 2011-08-29 ENCOUNTER — Ambulatory Visit: Payer: 59 | Admitting: Physical Therapy

## 2011-08-30 ENCOUNTER — Ambulatory Visit: Payer: 59 | Admitting: Physical Therapy

## 2011-09-10 ENCOUNTER — Ambulatory Visit: Payer: 59 | Admitting: Physical Therapy

## 2011-09-17 ENCOUNTER — Ambulatory Visit: Payer: 59 | Attending: Plastic Surgery | Admitting: Physical Therapy

## 2011-09-17 DIAGNOSIS — M25619 Stiffness of unspecified shoulder, not elsewhere classified: Secondary | ICD-10-CM | POA: Insufficient documentation

## 2011-09-17 DIAGNOSIS — IMO0001 Reserved for inherently not codable concepts without codable children: Secondary | ICD-10-CM | POA: Insufficient documentation

## 2011-09-17 DIAGNOSIS — M25519 Pain in unspecified shoulder: Secondary | ICD-10-CM | POA: Insufficient documentation

## 2011-09-17 DIAGNOSIS — M24519 Contracture, unspecified shoulder: Secondary | ICD-10-CM | POA: Insufficient documentation

## 2011-09-19 ENCOUNTER — Ambulatory Visit: Payer: 59

## 2011-09-24 ENCOUNTER — Ambulatory Visit: Payer: 59

## 2011-09-26 ENCOUNTER — Ambulatory Visit: Payer: 59

## 2011-10-01 ENCOUNTER — Encounter: Payer: 59 | Admitting: Physical Therapy

## 2011-10-01 ENCOUNTER — Ambulatory Visit: Payer: 59 | Attending: Plastic Surgery | Admitting: Physical Therapy

## 2011-10-01 DIAGNOSIS — IMO0001 Reserved for inherently not codable concepts without codable children: Secondary | ICD-10-CM | POA: Insufficient documentation

## 2011-10-01 DIAGNOSIS — M24519 Contracture, unspecified shoulder: Secondary | ICD-10-CM | POA: Insufficient documentation

## 2011-10-01 DIAGNOSIS — M25619 Stiffness of unspecified shoulder, not elsewhere classified: Secondary | ICD-10-CM | POA: Insufficient documentation

## 2011-10-01 DIAGNOSIS — M25519 Pain in unspecified shoulder: Secondary | ICD-10-CM | POA: Insufficient documentation

## 2011-10-08 ENCOUNTER — Encounter (INDEPENDENT_AMBULATORY_CARE_PROVIDER_SITE_OTHER): Payer: Self-pay | Admitting: General Surgery

## 2011-10-08 ENCOUNTER — Ambulatory Visit: Payer: 59 | Admitting: Physical Therapy

## 2011-10-10 ENCOUNTER — Encounter (INDEPENDENT_AMBULATORY_CARE_PROVIDER_SITE_OTHER): Payer: 59 | Admitting: General Surgery

## 2011-10-10 ENCOUNTER — Ambulatory Visit: Payer: 59

## 2011-10-25 ENCOUNTER — Encounter (INDEPENDENT_AMBULATORY_CARE_PROVIDER_SITE_OTHER): Payer: 59 | Admitting: General Surgery

## 2011-11-08 ENCOUNTER — Ambulatory Visit (HOSPITAL_COMMUNITY)
Admission: RE | Admit: 2011-11-08 | Discharge: 2011-11-08 | Disposition: A | Discharge status: Home / Self Care | Payer: 59 | Source: Ambulatory Visit | Attending: Family Medicine | Admitting: Family Medicine

## 2011-11-08 ENCOUNTER — Ambulatory Visit (INDEPENDENT_AMBULATORY_CARE_PROVIDER_SITE_OTHER): Payer: 59 | Admitting: Family Medicine

## 2011-11-08 ENCOUNTER — Encounter: Payer: Self-pay | Admitting: Family Medicine

## 2011-11-08 ENCOUNTER — Other Ambulatory Visit: Payer: Self-pay | Admitting: Family Medicine

## 2011-11-08 DIAGNOSIS — R109 Unspecified abdominal pain: Secondary | ICD-10-CM | POA: Insufficient documentation

## 2011-11-08 DIAGNOSIS — Z90710 Acquired absence of both cervix and uterus: Secondary | ICD-10-CM | POA: Insufficient documentation

## 2011-11-08 LAB — CBC WITH DIFFERENTIAL/PLATELET
Basophils Absolute: 0 10*3/uL (ref 0.0–0.1)
Basophils Relative: 0 % (ref 0–1)
Hemoglobin: 13.2 g/dL (ref 12.0–15.0)
MCHC: 33 g/dL (ref 30.0–36.0)
Neutro Abs: 3.2 10*3/uL (ref 1.7–7.7)
Neutrophils Relative %: 58 % (ref 43–77)
RDW: 13.6 % (ref 11.5–15.5)
WBC: 5.6 10*3/uL (ref 4.0–10.5)

## 2011-11-08 LAB — BASIC METABOLIC PANEL
Potassium: 3.7 mEq/L (ref 3.5–5.3)
Sodium: 141 mEq/L (ref 135–145)

## 2011-11-08 MED ORDER — IOHEXOL 300 MG/ML  SOLN
100.0000 mL | Freq: Once | INTRAMUSCULAR | Status: AC | PRN
  Administered 2011-11-08: 100 mL via INTRAVENOUS

## 2011-11-08 NOTE — Progress Notes (Signed)
  Subjective:    Patient ID: Sylvia Hernandez, female    DOB: [birth year], 41 y.o.   MRN: [id number]  HPI 1)abdominal pain: Pulling in llq everytime she stands up. Been present x 4 month. Was off and on about 2 x per month initially.  During the past 2 weeks has had constant pain in llq.  Pain intesified 4 days ago and started spreading toward groin, towards belly button, and towards inner upper leg. Difficulty sleeping due to pain.  Standing improves the pain some.  Sitting and moving left leg makes pain worse.  Taking tylenol for pain- but no improvement.  + nausea.  No vomiting. 2 loose stools this week.   No constipation.  No fever.  + chills.  No blood in stools.  No dark stools.  Had similar pain to this in past and was ovarian cyst- but ovaries are removed now. No dysuria.  No retention.  No urinary frequency.  No vaginal discharge.  + history of constipation in past.     Review of Systems As per above    Objective:   Physical Exam  Cardiovascular: Normal rate, regular rhythm and normal heart sounds.   No murmur heard. Pulmonary/Chest: Effort normal. No respiratory distress. She has no wheezes.  Abdominal: Soft. Bowel sounds are normal. She exhibits no distension and no mass. There is tenderness (in LLQ only). There is rebound (in LLQ only). There is no guarding.  Genitourinary: Rectum normal and vagina normal. Rectal exam shows no external hemorrhoid and no fissure. Guaiac negative stool. There is no rash, tenderness, lesion or injury on the right labia. There is no rash, tenderness, lesion or injury on the left labia. Cervix exhibits no motion tenderness (pt doesn't have a cervix.  only vaginal cuff s/p hysterectomy), no discharge and no friability. Right adnexum displays no mass, no tenderness and no fullness. Left adnexum displays tenderness. Left adnexum displays no mass and no fullness. No vaginal discharge found.  Musculoskeletal: She exhibits no edema.       + tenderness on palpation  of left inguinal area.  No bulging or signs of hernia.   Neurological: She is alert.  Skin: No rash noted.  Psychiatric: She has a normal mood and affect.          Assessment & Plan:

## 2011-11-09 ENCOUNTER — Encounter: Payer: Self-pay | Admitting: Family Medicine

## 2011-11-09 ENCOUNTER — Ambulatory Visit: Payer: 59 | Admitting: Family Medicine

## 2011-11-09 ENCOUNTER — Ambulatory Visit (INDEPENDENT_AMBULATORY_CARE_PROVIDER_SITE_OTHER): Payer: 59 | Admitting: Family Medicine

## 2011-11-09 DIAGNOSIS — R109 Unspecified abdominal pain: Secondary | ICD-10-CM

## 2011-11-09 MED ORDER — CYCLOBENZAPRINE HCL 10 MG PO TABS: 10.0000 mg | ORAL_TABLET | Freq: Every evening | ORAL | Status: AC | PRN

## 2011-11-09 MED ORDER — NAPROXEN 500 MG PO TABS: 500.0000 mg | ORAL_TABLET | Freq: Two times a day (BID) | ORAL | Status: DC

## 2011-11-09 NOTE — Patient Instructions (Signed)
Take naprosyn 2 x per day for pain.  Take flexeril as needed at bedtime.  This should help if the cause of the pain is muscular.  Return in 1 week for recheck.

## 2011-11-09 NOTE — Progress Notes (Signed)
  Subjective:    Patient ID: Sylvia Hernandez, female    DOB: 10-23-1970, 41 y.o.   MRN: [id number]  HPI Abdominal pain followup: Continued pain in LLQ.  With radiation to left groin, left inner thigh, and left flank.  After going to CT with contrast and drinking contrast has had + nausea and has had 3 loose stools.  Pain is constant.  Pain is 6/10 compared to yesterday that was 8/10.  No vomiting. Pain increases with pushing in on LLQ and with sitting and laying down.  No recent trauma to left hip joint.   Eating and drinking normally.  No dark stools.  No blood in stools.  Has to splint muscle in LLQ in order to void and have bowel movements. This pain initially started off and on 4 months ago when doing exercises in physical therapy. No fever.    Review of Systems As per above.     Objective:   Physical Exam  Constitutional: She appears well-developed and well-nourished. No distress.  HENT:  Head: Normocephalic and atraumatic.  Cardiovascular: Normal rate, regular rhythm and normal heart sounds.   No murmur heard. Pulmonary/Chest: Effort normal and breath sounds normal. No respiratory distress. She has no wheezes. She has no rales.  Abdominal: Soft. She exhibits no distension (LLQ only) and no mass. There is tenderness. There is no rebound and no guarding.       + tenderness with palpation from LLQ down into left inguinal area.  No hernias identified on exam.   Musculoskeletal: She exhibits no edema.  Neurological: She is alert.  Skin: No rash noted.  Psychiatric: She has a normal mood and affect.          Assessment & Plan:

## 2011-11-10 DIAGNOSIS — R109 Unspecified abdominal pain: Secondary | ICD-10-CM | POA: Insufficient documentation

## 2011-11-10 NOTE — Assessment & Plan Note (Signed)
Etiology unclear, per physical exam top of differential is diverticulitis.  Could also be MSK or hernia that is not identifable on exam, or other intraabdominal process.  Stool guiac negative. Will check CBC with dif, BMET, and order Stat CT scan for further eval. Pt to return tomorrow for recheck.

## 2011-11-11 NOTE — Assessment & Plan Note (Signed)
Reviewed CT and lab work results with patient.  All normal.  Pt reports minimal decrease in pain.  No fever. No increase in WBC.  Abdominal pain improved and pain most intense today in left inguinal area.  Will do a trial of NSAIDs-  Pt to take naprosyn 500mg  po bid x 7days.  Has never had any side effects to naprosyn in past.  Also will prescribe muscle relaxer to see if this helps discomfort of leg at night time.  If these treatments help then it will support the diagnosis of pain from a MSK injury.  Pt to return on Monday for f/up.

## 2011-11-12 ENCOUNTER — Encounter: Payer: Self-pay | Admitting: Family Medicine

## 2011-11-12 ENCOUNTER — Ambulatory Visit (INDEPENDENT_AMBULATORY_CARE_PROVIDER_SITE_OTHER): Payer: 59 | Admitting: Family Medicine

## 2011-11-12 VITALS — BP 126/81 | HR 71 | Ht 62.0 in | Wt 148.0 lb

## 2011-11-12 DIAGNOSIS — L608 Other nail disorders: Secondary | ICD-10-CM

## 2011-11-12 DIAGNOSIS — J45909 Unspecified asthma, uncomplicated: Secondary | ICD-10-CM

## 2011-11-12 DIAGNOSIS — Z0000 Encounter for general adult medical examination without abnormal findings: Secondary | ICD-10-CM

## 2011-11-12 DIAGNOSIS — Z1501 Genetic susceptibility to malignant neoplasm of breast: Secondary | ICD-10-CM

## 2011-11-12 DIAGNOSIS — R1032 Left lower quadrant pain: Secondary | ICD-10-CM | POA: Insufficient documentation

## 2011-11-12 DIAGNOSIS — E663 Overweight: Secondary | ICD-10-CM | POA: Insufficient documentation

## 2011-11-12 DIAGNOSIS — Z833 Family history of diabetes mellitus: Secondary | ICD-10-CM

## 2011-11-12 DIAGNOSIS — Z8349 Family history of other endocrine, nutritional and metabolic diseases: Secondary | ICD-10-CM

## 2011-11-12 LAB — POCT GLYCOSYLATED HEMOGLOBIN (HGB A1C): Hemoglobin A1C: 5.8

## 2011-11-12 LAB — TSH: TSH: 1.875 u[IU]/mL (ref 0.350–4.500)

## 2011-11-12 NOTE — Assessment & Plan Note (Signed)
Abdominal pain now resolved.  More clear now- since great response to flexeril and naprosyn- that this is musculoskeletal.  Pt to use naprosyn and flexeril prn.  Return if new or worsening of symptoms.  If any return of symptoms may need to consider PT referral. Pt doesn't want to do this for now.

## 2011-11-12 NOTE — Assessment & Plan Note (Deleted)
No personal history of breast cancer. Strong + family history.  + BRCA2 gene so has had bilateral mastectomy and hysterectomy and oophorectomy.

## 2011-11-12 NOTE — Patient Instructions (Signed)
Nail markings: Continue to monitor.  I think this will grow out.  If not resolved in the next few month return.  If it changes-lines get darker or wider, or if more of them occur return for recheck.   Labs: I will send results to you in the mail.  Weight management: Goal weight 120.  Exercise- walking- at park or around area you live-63min, 3 x per week.  Diet- goal is to eat 5-9 fruits and veggies per day.  See handout on plate method.   Left groin: Seems to be muscular. Continue naprosyn as needed.  If any return of pain please return for recheck- may need formal physical therapy.

## 2011-11-12 NOTE — Progress Notes (Signed)
  Subjective:    Patient ID: Sylvia Hernandez, female    DOB: 03-Oct-1970, 41 y.o.   MRN: [id number]  HPI  PMH  Left groin/LLQ pain follow up: Now much better.  Can sit down and sleeping much better.  Taking naprosyn now 1 x per day.   Flexeril seems to help the discomfort at bedtime.  Can now lay flat in bed and able to sleep well.  No pain with driving.  Now abd pain resolved.  Only minimal pain in left groin with movement off and on- up to 3/10 with movement.  No problems with bowel or bladder.  No back pain.   Color of nails: 2 black dots in nails present x 6 months.  Great toe of the left foot.  Concerned that this is fungal.  No pain.  No drainage.  No redness.  No swelling of toe.  Normal rom.    No trauma to toe.   Screenings/labs: Bilateral mastectomy- no breast exam needed Hysterectomy and oophrectomy- so pap needed.  All siblings have hypothyroidism- pt also overweight- agrees to [facility] screening Brother with dx of diabetes- agrees to A1C screen.   Weight management: Exercise- doesn't exercise Diet- "not bad", avoids fatty foods,  3-4 veggies per week.  Eats cheese, feels bloated with milk.  2 x week fruit.   Asthma: Albuterol inhaler- has to use 1 x per year.  Usually with seasonal allergies.  Never had to use a steroid inhaler.  Never had to be intubated.  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  Hasn't been hospitalized since age 62.      Review of Systems     Objective:   Physical Exam  Constitutional: She appears well-developed and well-nourished. No distress.  HENT:  Head: Normocephalic and atraumatic.  Right Ear: External ear normal.  Left Ear: External ear normal.  Eyes: Right eye exhibits no discharge. Left eye exhibits no discharge.  Neck: No thyromegaly present.  Cardiovascular: Normal rate, regular rhythm and normal heart sounds.   No murmur heard. Pulmonary/Chest: Effort normal and breath sounds normal. No respiratory distress. She has no wheezes.  Abdominal: Soft.  She exhibits no distension. There is no tenderness. There is no rebound and no guarding.  Genitourinary:       Examined on 11/08/11- no cervix present.  No pap needed.   Musculoskeletal: She exhibits no edema.       + mild tenderness with palpation of left groin area. Normal range of motion of left hip.  No pain with rom.   Skin: No rash noted.       4 dark lines, about 1-68mm in length-located at mid-nail level of right great toe.  Seem to be part of nail itself. No pain with palpation. No drainage. No redness. Full rom of great toe.   Psychiatric: She has a normal mood and affect.          Assessment & Plan:

## 2011-11-12 NOTE — Assessment & Plan Note (Signed)
Well controlled.  Only needs albuterol 1 x per year. Return if  new or worsening of symptoms.

## 2011-11-12 NOTE — Assessment & Plan Note (Signed)
Bilateral mastectomy- no breast exam needed Hysterectomy and oophrectomy- so pap needed.  All siblings have hypothyroidism- pt also overweight- agrees to [facility] screening Brother with dx of diabetes- agrees to A1C screen.

## 2011-11-12 NOTE — Assessment & Plan Note (Signed)
Continue to monitor.  I think this will grow out.  If not resolved in the next few month return.  If it changes-lines get darker or wider, or if more of them occur, or if any pain or drainage-- return for recheck.

## 2011-11-12 NOTE — Assessment & Plan Note (Signed)
Pt's goal is to lose 20lbs.  Discussed goals for diet and exercise.  See pt instructions for details.

## 2011-12-03 ENCOUNTER — Encounter (INDEPENDENT_AMBULATORY_CARE_PROVIDER_SITE_OTHER): Payer: Self-pay | Admitting: General Surgery

## 2011-12-06 ENCOUNTER — Ambulatory Visit (INDEPENDENT_AMBULATORY_CARE_PROVIDER_SITE_OTHER): Payer: 59 | Admitting: General Surgery

## 2011-12-06 ENCOUNTER — Encounter (INDEPENDENT_AMBULATORY_CARE_PROVIDER_SITE_OTHER): Payer: Self-pay | Admitting: General Surgery

## 2011-12-06 VITALS — BP 106/86 | HR 74 | Temp 98.6°F | Resp 14 | Ht 62.0 in | Wt 147.0 lb

## 2011-12-06 DIAGNOSIS — Z1501 Genetic susceptibility to malignant neoplasm of breast: Secondary | ICD-10-CM

## 2011-12-06 NOTE — Patient Instructions (Signed)
Continue regular self exams  

## 2011-12-11 ENCOUNTER — Encounter (INDEPENDENT_AMBULATORY_CARE_PROVIDER_SITE_OTHER): Payer: Self-pay | Admitting: General Surgery

## 2011-12-11 NOTE — Progress Notes (Signed)
Subjective:     Patient ID: Sylvia Hernandez, female   DOB: [birth year], 41 y.o.   MRN: [id number]  HPI The patient is a 41 year old white female who is about 10 months out from prophylactic bilateral mastectomies for BRCA2 positivity. Her course was complicated by blood clots prior to surgery. She required anticoagulation and then bled after surgery requiring removal of her right tissue expander. She also had significant problems with frozen shoulders. She is doing much better now. She is getting good range of motion back in her shoulders. She still has some sensitivity to her chest wall.  Review of Systems  Constitutional: Negative.   HENT: Negative.   Eyes: Negative.   Respiratory: Negative.   Cardiovascular: Negative.   Gastrointestinal: Negative.   Genitourinary: Negative.   Musculoskeletal: Negative.   Skin: Negative.   Neurological: Negative.   Hematological: Negative.   Psychiatric/Behavioral: Negative.        Objective:   Physical Exam  Constitutional: She is oriented to person, place, and time. She appears well-developed and well-nourished.  HENT:  Head: Normocephalic and atraumatic.  Eyes: Conjunctivae and EOM are normal. Pupils are equal, round, and reactive to light.  Neck: Normal range of motion. Neck supple.  Cardiovascular: Normal rate, regular rhythm and normal heart sounds.   Pulmonary/Chest: Effort normal and breath sounds normal.       No palpable mass of either chest wall. Her mastectomy incisions have healed nicely. No palpable axillary supraclavicular or cervical lymphadenopathy  Abdominal: Soft. Bowel sounds are normal. She exhibits no mass. There is no tenderness.  Musculoskeletal: Normal range of motion.  Neurological: She is alert and oriented to person, place, and time.  Skin: Skin is warm and dry.  Psychiatric: She has a normal mood and affect. Her behavior is normal.       Assessment:     10 months status post prophylactic bilateral mastectomies    Plan:     At this point she will continue to do regular self exams. She states that the plastic surgeons will not progress with the reconstruction until she can look at and touch her chest wall. We will plan to see her back in about 3 months.

## 2012-01-09 ENCOUNTER — Encounter (INDEPENDENT_AMBULATORY_CARE_PROVIDER_SITE_OTHER): Payer: Self-pay | Admitting: General Surgery

## 2012-01-17 ENCOUNTER — Ambulatory Visit (INDEPENDENT_AMBULATORY_CARE_PROVIDER_SITE_OTHER): Payer: 59 | Admitting: Family Medicine

## 2012-01-17 ENCOUNTER — Encounter: Payer: Self-pay | Admitting: Family Medicine

## 2012-01-17 VITALS — BP 129/91 | HR 92 | Ht 62.0 in | Wt 145.4 lb

## 2012-01-17 DIAGNOSIS — G5602 Carpal tunnel syndrome, left upper limb: Secondary | ICD-10-CM

## 2012-01-17 DIAGNOSIS — G5600 Carpal tunnel syndrome, unspecified upper limb: Secondary | ICD-10-CM

## 2012-01-17 NOTE — Progress Notes (Signed)
  Subjective:    Patient ID: Sylvia Hernandez, female    DOB: 05/08/1971, 41 y.o.   MRN: [id number]  HPI Numbness in left hand: Patient reports numbness in all fingers of her left hand except for her pinky. States that this started one to 2 months ago. Pain increases when she grabs items or lifts heavy items. Nothing seems to make it better. No fever. No neck or back pain. No history of neck or back problem.  Has been using left hand much more than right for the past few months and she has been having lymphedema problems in her right hand s/p mastectomy.  Smoking status reviewed.   Review of Systems As per above    Objective:   Physical Exam  Constitutional: She appears well-developed and well-nourished.  HENT:  Head: Normocephalic and atraumatic.  Cardiovascular: Normal rate, regular rhythm and normal heart sounds.   No murmur heard. Pulmonary/Chest: Effort normal. No respiratory distress. She has no wheezes.  Musculoskeletal:       Left hand exam: Normal rom. Normal strength.  No joint redness. No joint swelling.  + discomfort with palpation and movement of hands.  + decreased sensation/ sensation of tingling and numbness in right hand on palpation in all fingers except for 5th digit.  Neg tinel's + phalens    Neurological: She is alert.  Skin: No rash noted.  Psychiatric: She has a normal mood and affect.          Assessment & Plan:

## 2012-01-17 NOTE — Patient Instructions (Addendum)
Purchase carpal tunnel brace-- use consistently on left hand Use aleve as needed.  Return in 2-4 weeks if no improvement.   Carpal Tunnel Syndrome The carpal tunnel is a narrow hollow area in the wrist. It is formed by the wrist bones and ligaments. Nerves, blood vessels, and tendons (cord like structures which attach muscle to bone) on the palm side (the side of your hand in the direction your fingers bend) of your hand pass through the carpal tunnel. Repeated wrist motion or certain diseases may cause swelling within the tunnel. (That is why these are called repetitive trauma (damage caused by over use) disorders. It is also a common problem in late pregnancy.) This swelling pinches the main nerve in the wrist (median nerve) and causes the painful condition called carpal tunnel syndrome. A feeling of "pins and needles" may be noticed in the fingers or hand; however, the entire arm may ache from this condition. Carpal tunnel syndrome may clear up by itself. Cortisone injections may help. Sometimes, an operation may be needed to free the pinched nerve. An electromyogram (a type of test) may be needed to confirm this diagnosis (learning what is wrong). This is a test which measures nerve conduction. The nerve conduction is usually slowed in a carpal tunnel syndrome. HOME CARE INSTRUCTIONS   If your caregiver prescribed medication to help reduce swelling, take as directed.   If you were given a splint to keep your wrist from bending, use it as instructed. It is important to wear the splint at night. Use the splint for as long as you have pain or numbness in your hand, arm or wrist. This may take 1 to 2 months.   If you have pain at night, it may help to rub or shake your hand, or elevate your hand above the level of your heart (the center of your chest).   It is important to give your wrist a rest by stopping the activities that are causing the problem. If your symptoms (problems) are work-related, you  may need to talk to your employer about changing to a job that does not require using your wrist.   Only take over-the-counter or prescription medicines for pain, discomfort, or fever as directed by your caregiver.   Following periods of extended use, particularly strenuous use, apply an ice pack wrapped in a towel to the anterior (palm) side of the affected wrist for 20 to 30 minutes. Repeat as needed three to four times per day. This will help reduce the swelling.   Follow all instructions for follow-up with your caregiver. This includes any orthopedic referrals, physical therapy, and rehabilitation. Any delay in obtaining necessary care could result in a delay or failure of your condition to heal.  SEEK IMMEDIATE MEDICAL CARE IF:   You are still having pain and numbness following a week of treatment.   You develop new, unexplained symptoms.   Your current symptoms are getting worse and are not helped or controlled with medications.  MAKE SURE YOU:   Understand these instructions.   Will watch your condition.   Will get help right away if you are not doing well or get worse.  Document Released: 07/13/2000 Document Revised: 07/05/2011 Document Reviewed: 06/01/2011 [facility] Patient Information 2012 [place], [place].

## 2012-01-19 DIAGNOSIS — G5602 Carpal tunnel syndrome, left upper limb: Secondary | ICD-10-CM | POA: Insufficient documentation

## 2012-01-19 NOTE — Assessment & Plan Note (Signed)
History and exam suggestive of carpal tunnel syndrome.  Pt to purchase brace for left hand and use consistently for symptom relief.  Can use NSAIDs as needed for discomfort.  Pt to return in 2-4 weeks for recheck.

## 2012-01-28 ENCOUNTER — Encounter: Payer: Self-pay | Admitting: Family Medicine

## 2012-01-28 DIAGNOSIS — E785 Hyperlipidemia, unspecified: Secondary | ICD-10-CM | POA: Insufficient documentation

## 2012-05-13 ENCOUNTER — Ambulatory Visit (INDEPENDENT_AMBULATORY_CARE_PROVIDER_SITE_OTHER): Payer: 59 | Admitting: Family Medicine

## 2012-05-13 ENCOUNTER — Encounter: Payer: Self-pay | Admitting: Family Medicine

## 2012-05-13 VITALS — BP 128/83 | HR 88 | Temp 98.0°F | Ht 62.0 in | Wt 149.0 lb

## 2012-05-13 DIAGNOSIS — R1032 Left lower quadrant pain: Secondary | ICD-10-CM

## 2012-05-13 DIAGNOSIS — Z23 Encounter for immunization: Secondary | ICD-10-CM

## 2012-05-13 NOTE — Patient Instructions (Addendum)
Will order pelvic ultrasound and hip xray  Will discuss approach to care pending these test results  Should get results in 1-2 days after test is done- I will call you

## 2012-05-13 NOTE — Progress Notes (Signed)
  Subjective:    Patient ID: Sylvia Hernandez, female    DOB: 01-Feb-1971, 41 y.o.   MRN: [id number]  HPIGroin pain  Here to discuss 1 year of groin pain radiating down left leg.  In April had normal labs and CT, was helped with muscle relaxer's and naproxen.  Improved but did not fully resolve.  In the past month has worsened.  Described it as sharp pain starting in the umbilical area and radiated to the left and then down her hip.  "feels like I had a pillow between my thigh and belly"  Also with burning type pain in groin and hip area.  Can be at rest.  Walking makes no difference.  Needs to sleep on her right side to alleviate left sided pain. No different with meals.  Has tried heat and ice without improvement- works sometimes but at other times does not.    Notes bowel movements twice per day, normal.  Hysterectomy and ovariectomy for BRCA 2 no periods.     Patient reports in past pain was though to be due to ovarian cysts.    I have reviewed patient's  PMH, FH, and Social history and Medications as related to this visit.   Review of Systems See HPi    Objective:   Physical Exam GEN: Alert & Oriented, No acute distress CV:  Regular Rate & Rhythm, no murmur Respiratory:  Normal work of breathing, CTAB Abd:  + BS, soft, no tenderness to palpation Ext: no pre-tibial edema MSK:  Left hip with normal rom compared to right but left with pain on external rotation.   Pain worst when I palpate over lateral left border or incision from hysterectomy as well as in inguinal region. No hernia appreciated supine or standing. No pain in low back or on palpation of trochanteric bursa      Assessment & Plan:

## 2012-05-13 NOTE — Assessment & Plan Note (Signed)
Most likley msk with some nerve impingement at hip causing symptoms.  Reviewed CT from April 2013 which noted bilateral ovaries although patient has bad them removed 1 year prior due to BRCA 2 carrrier.  Will order pelvic US to evaluate for mass and for inguinal hernia.  Will also check hip xray to evaluate for occult fracture patient at some increased risk of fracture given no estrogen.  Will plan to discuss results and further approach with patient.

## 2012-05-16 ENCOUNTER — Ambulatory Visit (HOSPITAL_COMMUNITY)
Admission: RE | Admit: 2012-05-16 | Discharge: 2012-05-16 | Disposition: A | Discharge status: Home / Self Care | Payer: 59 | Source: Ambulatory Visit | Attending: Family Medicine | Admitting: Family Medicine

## 2012-05-16 DIAGNOSIS — R1032 Left lower quadrant pain: Secondary | ICD-10-CM

## 2012-05-16 DIAGNOSIS — M25559 Pain in unspecified hip: Secondary | ICD-10-CM | POA: Insufficient documentation

## 2012-05-16 DIAGNOSIS — Z90710 Acquired absence of both cervix and uterus: Secondary | ICD-10-CM | POA: Insufficient documentation

## 2012-05-19 ENCOUNTER — Telehealth: Payer: Self-pay | Admitting: Family Medicine

## 2012-05-19 DIAGNOSIS — R1032 Left lower quadrant pain: Secondary | ICD-10-CM

## 2012-05-19 NOTE — Assessment & Plan Note (Signed)
Called and left  Message.  Told patient was very reassured with hip xray and US pelvis which showed no pelvic of hip stress fx, no evidence of ovaries or other mass as was noted on previous CT after oophorectomy, and no evidence of inguinal hernia.  Advised supportive care for muscle strain, and if continues to cause discomfort- to follow-up with PCP.

## 2012-05-19 NOTE — Telephone Encounter (Signed)
Called and left  Message.  Told patient was very reassured with hip xray and US pelvis which showed no pelvic of hip stress fx, no evidence of ovaries or other mass as was noted on previous CT after oophorectomy, and no evidence of inguinal hernia.  Advised supportive care for muscle strain, and if continues to cause discomfort- to follow-up with PCP. 

## 2012-08-22 ENCOUNTER — Ambulatory Visit: Payer: 59 | Admitting: Oncology

## 2012-08-25 ENCOUNTER — Telehealth: Payer: Self-pay | Admitting: Oncology

## 2012-08-25 NOTE — Telephone Encounter (Signed)
Returned pt's call and r/s 1/24 appt to 3/3 (surviorship). Pt aware of new d/t.

## 2012-09-29 ENCOUNTER — Ambulatory Visit (HOSPITAL_BASED_OUTPATIENT_CLINIC_OR_DEPARTMENT_OTHER): Payer: 59 | Admitting: Oncology

## 2012-09-29 VITALS — BP 135/83 | HR 97 | Temp 98.2°F | Resp 20 | Ht 62.0 in | Wt 152.4 lb

## 2012-09-29 DIAGNOSIS — I82409 Acute embolism and thrombosis of unspecified deep veins of unspecified lower extremity: Secondary | ICD-10-CM

## 2012-09-29 DIAGNOSIS — Z9010 Acquired absence of unspecified breast and nipple: Secondary | ICD-10-CM

## 2012-09-29 DIAGNOSIS — I2699 Other pulmonary embolism without acute cor pulmonale: Secondary | ICD-10-CM

## 2012-09-29 DIAGNOSIS — Z1509 Genetic susceptibility to other malignant neoplasm: Secondary | ICD-10-CM

## 2012-09-29 DIAGNOSIS — Z1501 Genetic susceptibility to malignant neoplasm of breast: Secondary | ICD-10-CM

## 2012-09-29 NOTE — Patient Instructions (Addendum)
i will see you back on a as needed basis

## 2012-09-29 NOTE — Progress Notes (Signed)
OFFICE PROGRESS NOTE    Levert Feinstein, MD [address] [place] [address]  DIAGNOSIS: 42 year old female BRCA 2 carrier with deleterious mutation in the ED 13 [id number]-4150G sequencing patient also has history of DVT and pulmonary embolism secondary to ovarian surgery and breast surgery.  PRIOR THERAPY:  #1 patient is status post bilateral salpingo-oophorectomy performed in December 2011 with the final pathology revealing no evidence of ovarian malignancy. This was a risk reducing surgery.  #2 patient is status post bilateral prophylactic mastectomies with immediate reconstruction.  #3 patient and developed thrombosis of the lower extremities and pulmonary embolism secondary to her pelvic surgery.  anticoagulated with Coumadin trying to keep her INR between 2 and 2.5. Plan is to have her anticoagulated for a total of 6 months.   #4 patient has now completed 6 months of anticoagulation therapy and she has been recommended to discontinue Coumadin  CURRENT THERAPY: Observation  INTERVAL HISTORY: Sylvia Hernandez 42 y.o. female returns for followup visit.  Clinically she does doing well. She overall has done well. Today she feels well she has no fevers chills night sweats headaches shortness of breath chest pains palpitations no swelling in her legs. Remainder of the 10 point review of systems is negative.  MEDICAL HISTORY: Past Medical History  Diagnosis Date  . GERD (gastroesophageal reflux disease)   . Migraine   . Allergy   . FHx: BRCA2 gene positive   . History of blood clots   . Meniere's disease   . Hearing loss   . Asthma     hospitalized frequently as child, now well controlled  . ECZEMA, ATOPIC DERMATITIS 09/26/2006    Qualifier: Diagnosis of  By: Bradly Bienenstock      ALLERGIES:  is allergic to codeine; ibuprofen; and shellfish allergy.  MEDICATIONS:  Current Outpatient Prescriptions  Medication Sig Dispense Refill  . albuterol (VENTOLIN HFA) 108  (90 BASE) MCG/ACT inhaler Inhale 2 puffs into the lungs daily as needed.       . cyclobenzaprine (FLEXERIL) 10 MG tablet       . naproxen (NAPROSYN) 500 MG tablet Take 1 tablet (500 mg total) by mouth 2 (two) times daily with a meal. X 7 days then as needed.  30 tablet  0   No current facility-administered medications for this visit.    SURGICAL HISTORY:  Past Surgical History  Procedure Laterality Date  . Tympanomastoidectomy  07/18/07  . Ovary surgery      removal of ovaries due to BRCA2 gene positive  . Cesarean section  03/20/01  . Breast surgery      bil masty  . Abdominal hysterectomy  02/12/06    REVIEW OF SYSTEMS:  Pertinent items are noted in HPI.   PHYSICAL EXAMINATION: General appearance: alert, cooperative and appears stated age Head: Normocephalic, without obvious abnormality, atraumatic Neck: no adenopathy, no carotid bruit, no JVD, supple, symmetrical, trachea midline and thyroid not enlarged, symmetric, no tenderness/mass/nodules Resp: clear to auscultation bilaterally and normal percussion bilaterally Back: symmetric, no curvature. ROM normal. No CVA tenderness. Cardio: regular rate and rhythm, S1, S2 normal, no murmur, click, rub or gallop and normal apical impulse GI: soft, non-tender; bowel sounds normal; no masses,  no organomegaly Extremities: extremities normal, atraumatic, no cyanosis or edema Neurologic: Alert and oriented X 3, normal strength and tone. Normal symmetric reflexes. Normal coordination and gait  ECOG PERFORMANCE STATUS: 0 - Asymptomatic  Blood pressure 135/83, pulse 97, temperature 98.2 F (36.8 C), temperature source Oral, resp.  rate 20, height 5\' 2"  (1.575 m), weight 152 lb 6.4 oz (69.128 kg).  LABORATORY DATA: Lab Results  Component Value Date   WBC 5.6 11/08/2011   HGB 13.2 11/08/2011   HCT 40.0 11/08/2011   MCV 87.1 11/08/2011   PLT 264 11/08/2011      Chemistry      Component Value Date/Time   NA 141 11/08/2011 1450   K 3.7  11/08/2011 1450   CL 102 11/08/2011 1450   CO2 28 11/08/2011 1450   BUN 17 11/08/2011 1450   CREATININE 0.84 11/08/2011 1450   CREATININE 0.88 02/09/2011 1427      Component Value Date/Time   CALCIUM 9.8 11/08/2011 1450   ALKPHOS 79 01/11/2011 0918   AST 17 01/11/2011 0918   ALT 16 01/11/2011 0918   BILITOT 0.2* 01/11/2011 0918       RADIOGRAPHIC STUDIES:  No results found.  ASSESSMENT:  42 year old female who is BRCA2 carrier for the BRCA mutation. She is status post prophylactic risk reducing surgeries including bilateral salpingo-oophorectomies performed December 2011. She subsequently in the summer of this year had  Prophylactic bilateral mastectomies performed. Unfortunately her course was complicated develop by development of thrombosis of the lower extremities and pulmonary embolism. Most likely felt to be due to her pelvic surgery. Patient has now completed 6 months of anticoagulation with Coumadin. Throughout her course her Coumadin INR remain very therapeutic and very compliant. She has done very well.overall patient is doing well at this time I will only see her on as needed basis. I have recommended that she touch basis with her surgeon  PLAN:   #1 followup as needed  All questions were answered. The patient knows to call the clinic with any problems, questions or concerns. We can certainly see the patient much sooner if necessary.  I spent 20 minutes counseling the patient face to face. The total time spent in the appointment was 30 minutes.    Drue Second, MD Medical/Oncology [facility] [phone] (beeper) [phone] (Office)  09/29/2012, 11:51 AM

## 2012-10-01 ENCOUNTER — Telehealth: Payer: Self-pay | Admitting: Oncology

## 2012-10-01 NOTE — Telephone Encounter (Signed)
Pt to return as needed. 

## 2012-10-19 IMAGING — US US PELVIS COMPLETE
1 series · 14 of 25 positions shown · non-contrast
Comparison: CT [DATE]

CLINICAL DATA: Left lower quadrant pain.  Prior hysterectomy and
bilateral oophorectomy.  Recent CT suggested the presence of the
ovaries bilaterally.



[Series 1: us pelvis complete · 36 acquisitions, 14 frames shown]
[im 1/36]
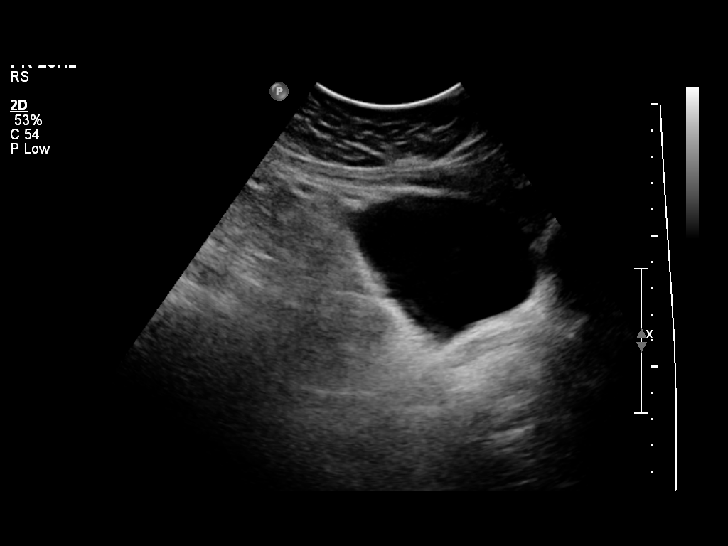
[im 3/36]
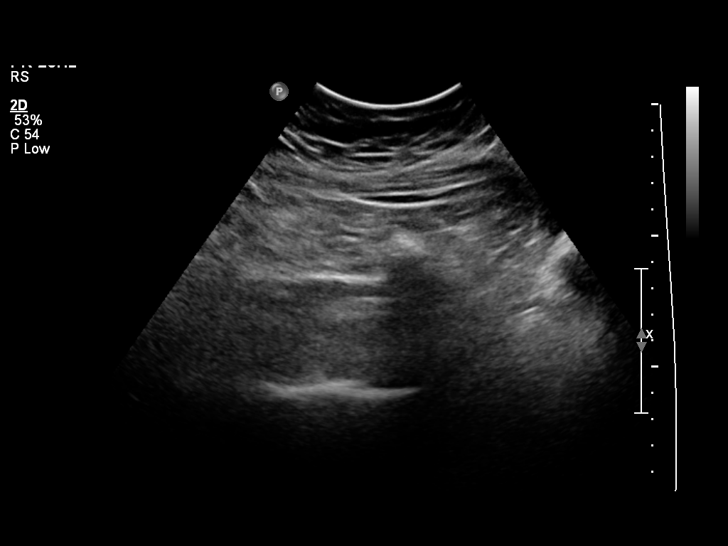
[im 6/36]
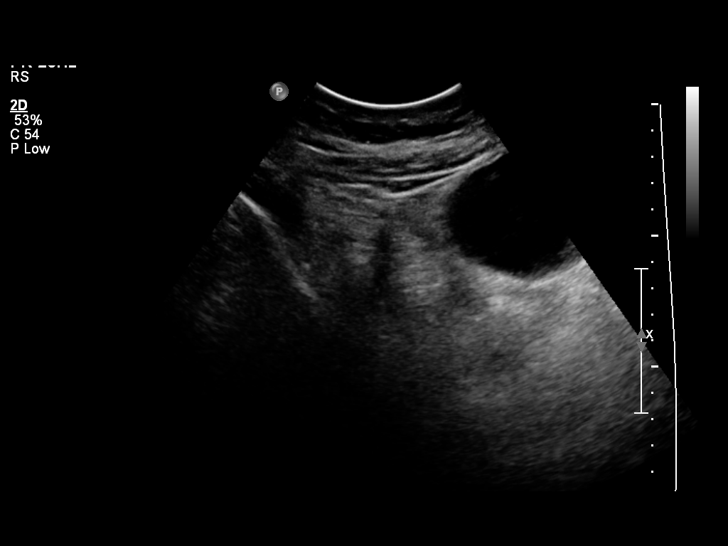
[im 9/36]
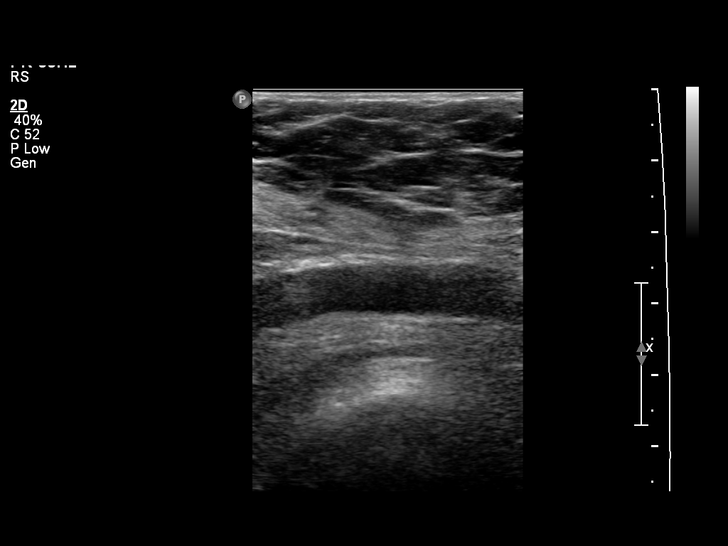
[im 12/36]
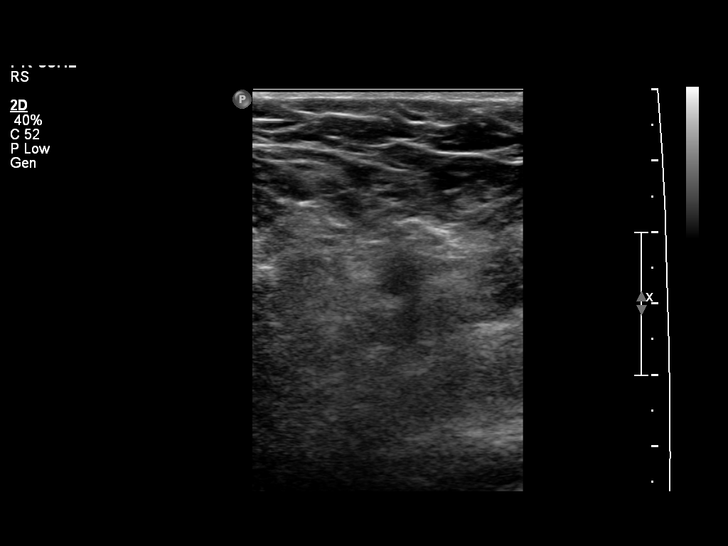
[im 14/36]
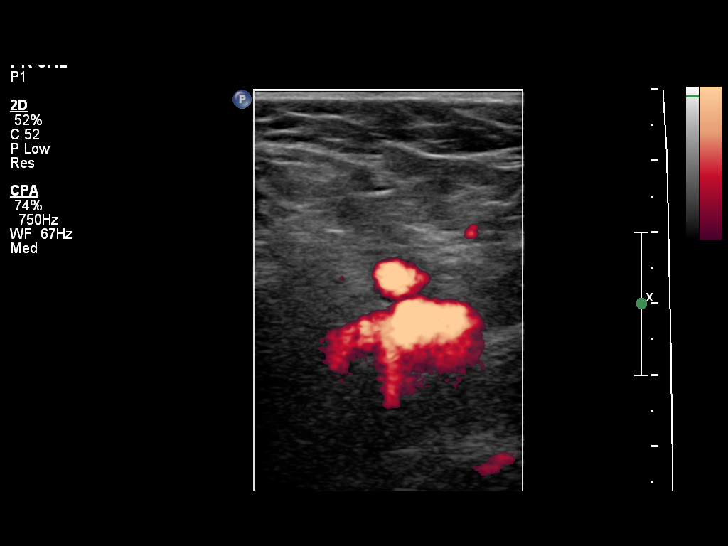
[im 17/36]
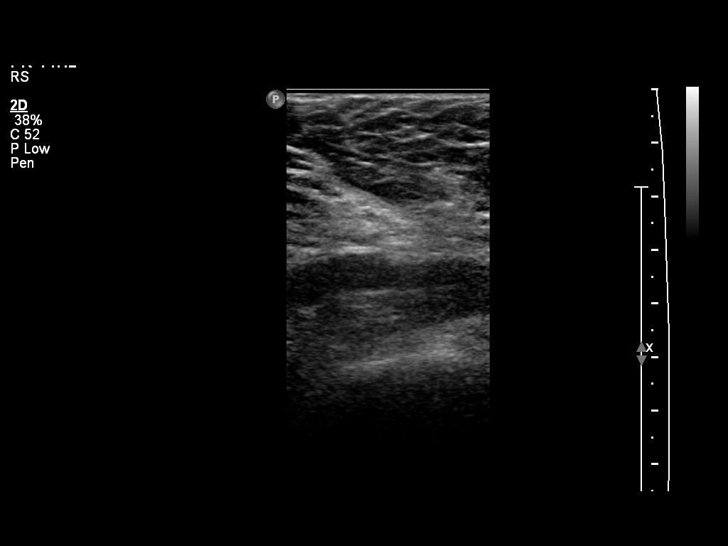
[im 19/36]
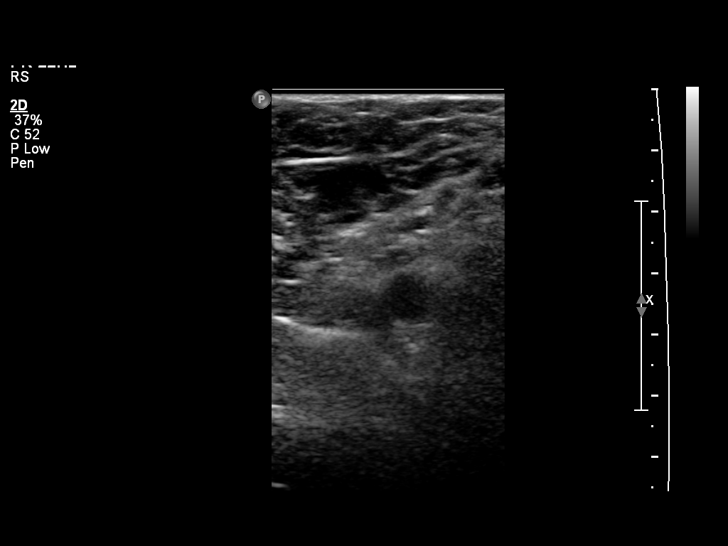
[im 22/36]
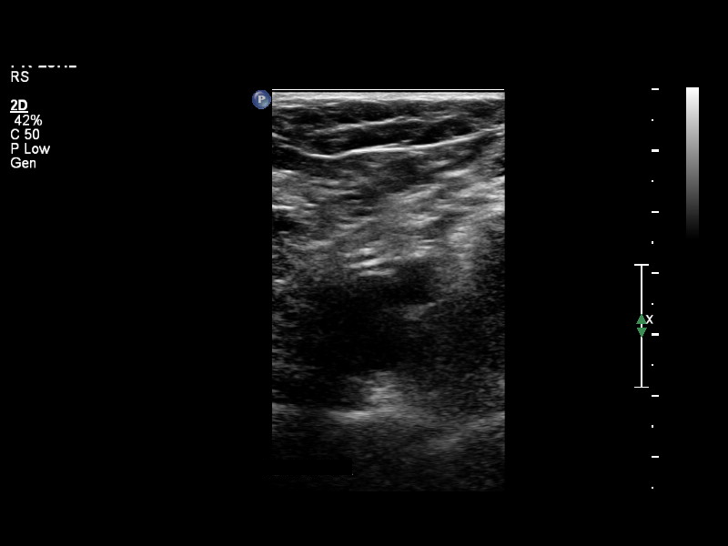
[im 24/36]
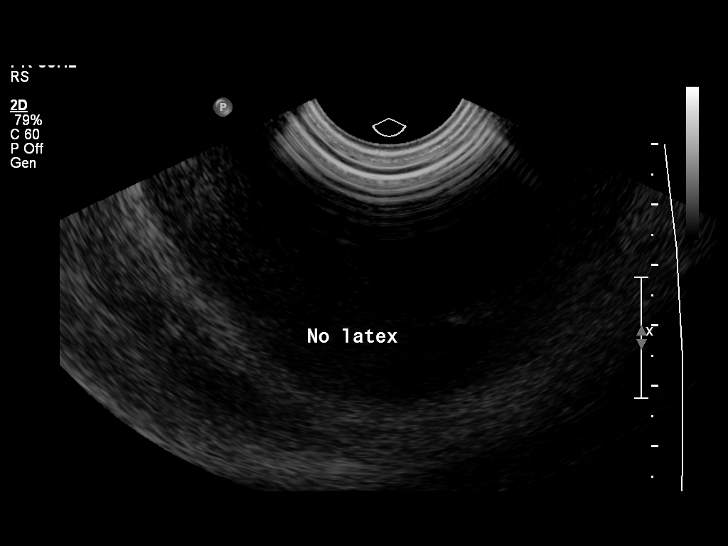
[im 27/36]
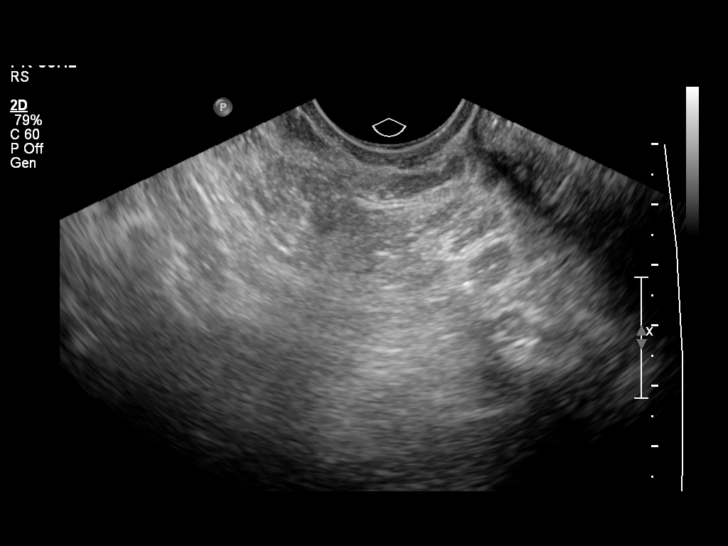
[im 30/36]
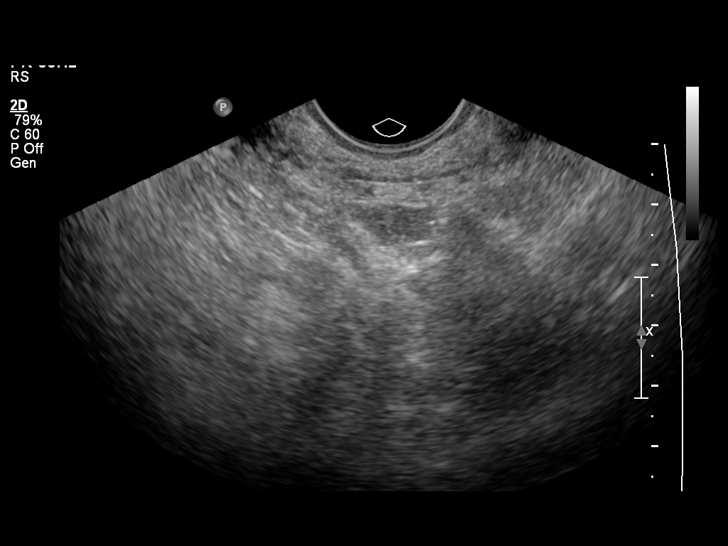
[im 33/36]
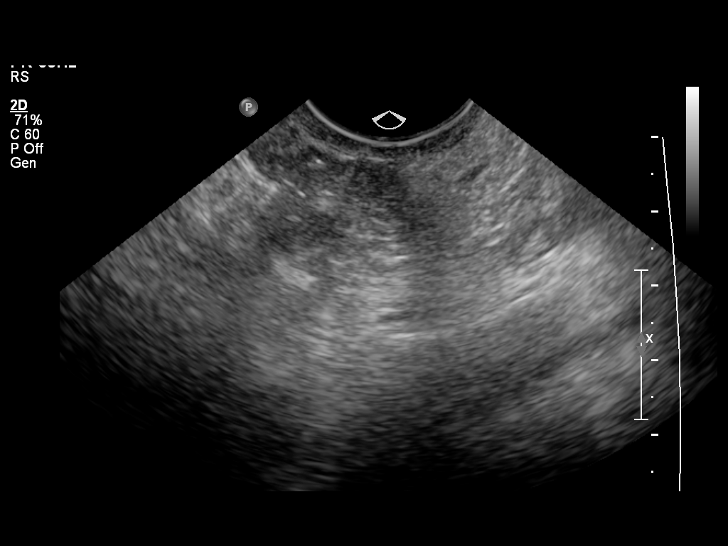
[im 36/36]
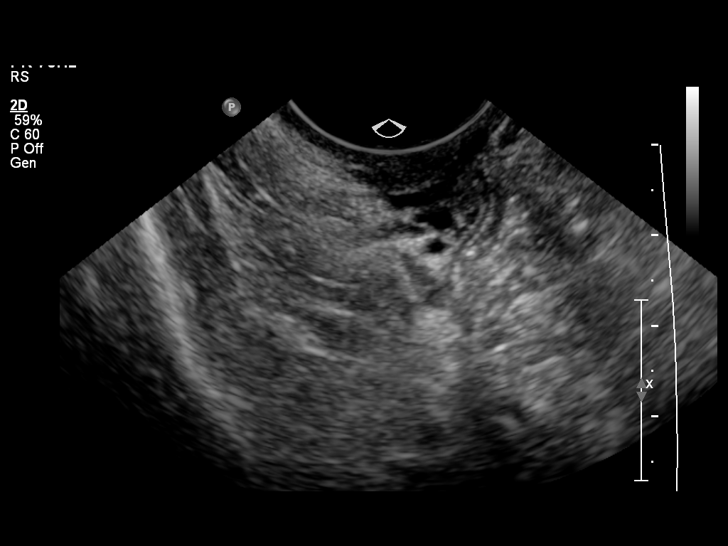

[14 of 25 positions shown; findings below may reference images not displayed]

FINDINGS: Uterus: Has been surgically removed.  A normal vaginal cuff is seen

Endometrium: Not applicable

Right ovary:  Is not seen with confidence either transabdominally
or endovaginally

Left ovary: Is not seen with confidence either transabdominally or
endovaginally

Other findings: No pelvic fluid is seen.  Imaging over both groins
was performed and shows no sonographic evidence for inguinal hernia
IMPRESSION: Normal post hysterectomy vaginal cuff with non-visualized ovaries.

## 2012-10-19 IMAGING — CR DG HIP (WITH OR WITHOUT PELVIS) 2-3V*L*
2 series · 2 of 2 positions shown · non-contrast
Comparison: 10/21/2003

CLINICAL DATA: Chronic hip pain.  Risk for osteopenia.  No trauma

LEFT HIP - COMPLETE 2+ VIEW

[view not recorded (1 of 2)]
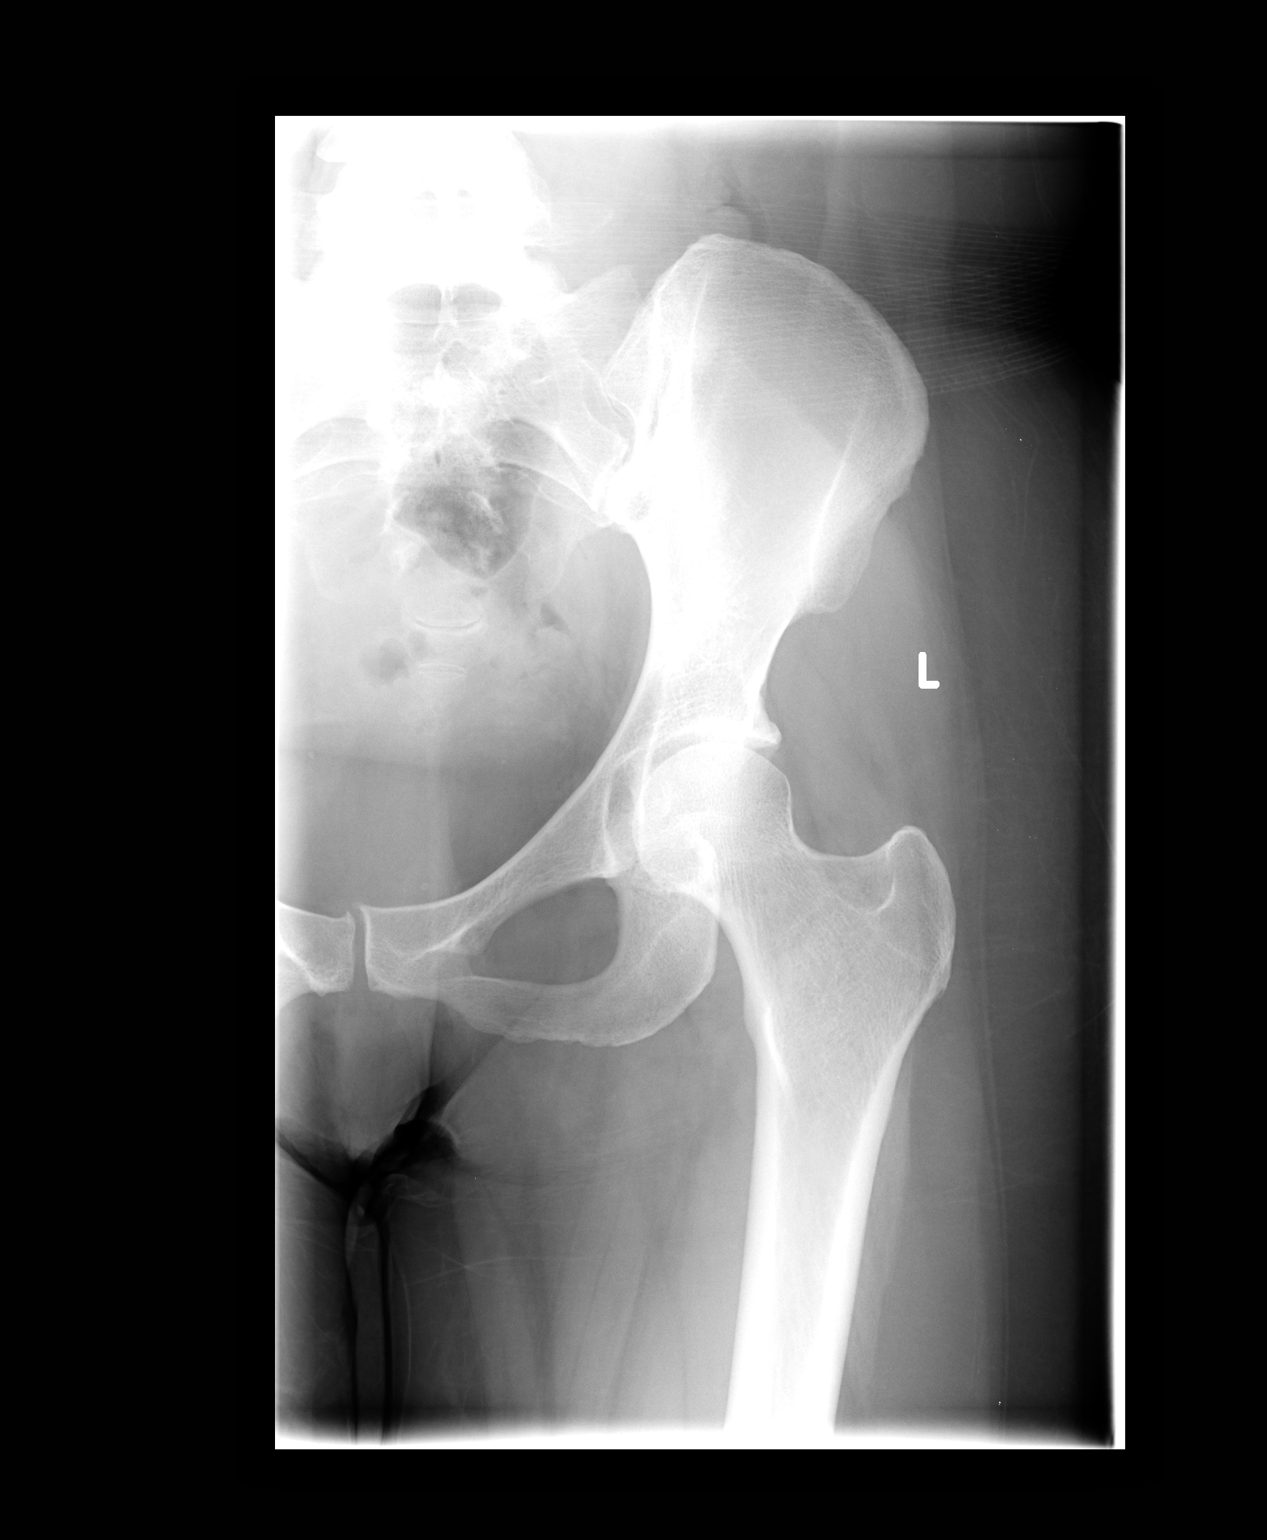

[view not recorded (2 of 2)]
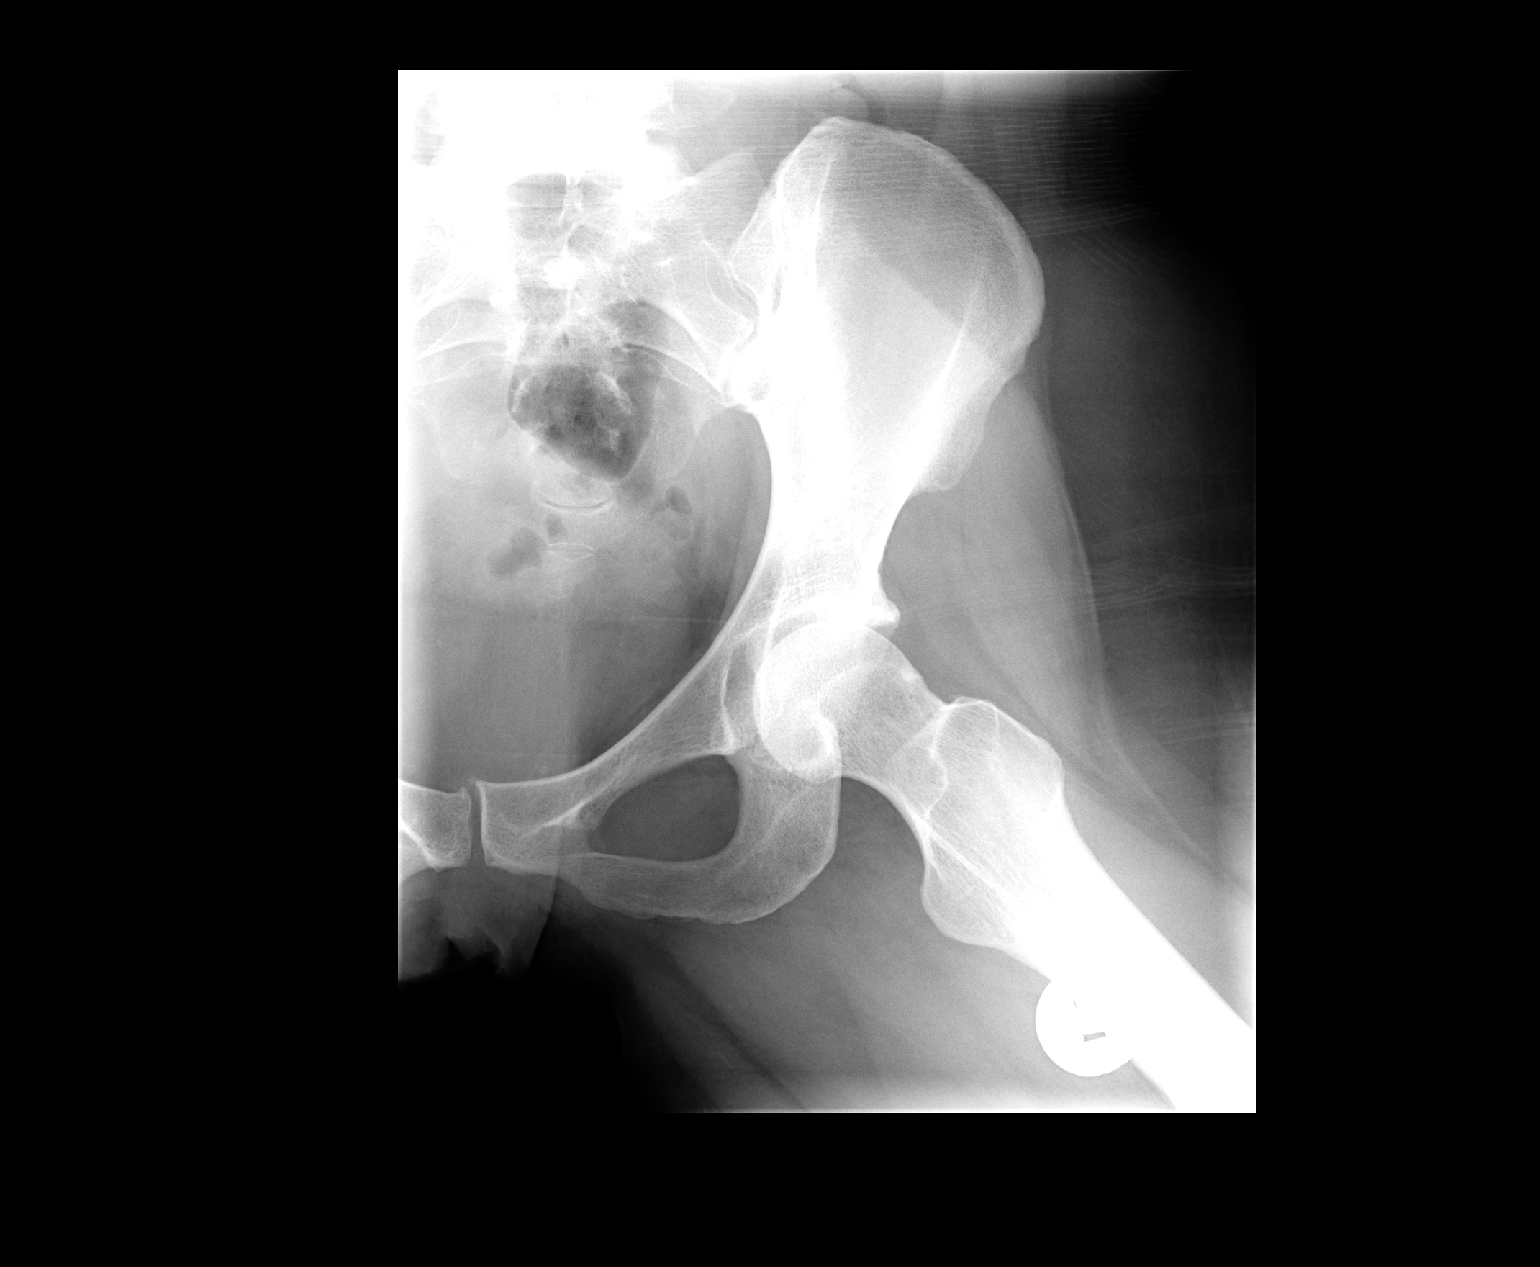

[2 of 2 positions shown; findings below may reference images not displayed]

FINDINGS: Bone density appears within normal limits.  The left hip
joint appears maintained with no joint space narrowing seen.  The
left sacroiliac joint appears intact.  No acute fracture or
dislocation is seen.  No focal bony abnormality is noted.  Soft
tissue planes appear intact.
IMPRESSION: Negative

## 2012-10-20 ENCOUNTER — Encounter: Payer: Self-pay | Admitting: Sports Medicine

## 2012-10-20 ENCOUNTER — Ambulatory Visit (INDEPENDENT_AMBULATORY_CARE_PROVIDER_SITE_OTHER): Payer: 59 | Admitting: Sports Medicine

## 2012-10-20 VITALS — BP 117/82 | HR 88 | Temp 98.9°F | Ht 62.0 in | Wt 151.0 lb

## 2012-10-20 DIAGNOSIS — L600 Ingrowing nail: Secondary | ICD-10-CM

## 2012-10-20 NOTE — Patient Instructions (Addendum)
Toenail Removal  Toenails may need to be removed because of injury, infections, or to correct abnormal growth. A special non-stick bandage will likely be put tightly on your toe to prevent bleeding. Often times a new nail will grow back. Sometimes the new nail may be deformed. Most of the time when a nail is lost, it will gradually heal, but may be sensitive for a long time.  HOME CARE INSTRUCTIONS    · Keep your foot elevated to relieve pain and swelling. This will require lying in bed or on a couch with the leg on pillows or sitting in a recliner with the leg up. Walking or letting your leg dangle may increase swelling, slow healing, and cause throbbing pain.  · Keep your bandage dry and clean.  · Change your bandage in 24 hours.  · After your bandage is changed, soak your foot in warm, soapy water for 10 to 20 minutes. Do this 3 times per day. This helps reduce pain and swelling. After soaking your foot apply a clean, dry bandage. Change your bandage if it is wet or dirty.  · Only take over-the-counter or prescription medicines for pain, discomfort, or fever as directed by your caregiver.  · See your caregiver as needed for problems.  You might need a tetanus shot now if:  · You have no idea when you had the last one.  · You have never had a tetanus shot before.  · The injured area had dirt in it.  If you need a tetanus shot, and you decide not to get one, there is a rare chance of getting tetanus. Sickness from tetanus can be serious. If you did get a tetanus shot, your arm may swell, get red and warm to the touch at the shot site. This is common and not a problem.  SEEK IMMEDIATE MEDICAL CARE IF:    · You have increased pain, swelling, redness, warmth, drainage, or bleeding.  · You have a fever.  · You have swelling that spreads from your toe into your foot.  Document Released: 04/14/2003 Document Revised: 10/08/2011 Document Reviewed: 07/26/2008  [employer]® Patient Information ©2013 [employer].

## 2012-10-21 DIAGNOSIS — L600 Ingrowing nail: Secondary | ICD-10-CM | POA: Insufficient documentation

## 2012-10-21 NOTE — Assessment & Plan Note (Addendum)
Given reoccurance of ingrown toe and significant B ingrowth noted discussed options with patient who elected for removal with ablation.  Pt expressed understanding of risks/benefits and noted that the intention is to ablate the nail bed to prevent regrowth of the nail.    PROCEDURE NOTE: Ingrown nail, no infection.  Patient given informed consent, signed copy in the chart. Appropriate time out taken.  Large rubberband tightened with forceps used as tourniquet placed at base of the toe.  Area prepped and draped in usual sterile fashion.  Digital block done using 2% lidocaine without epinephrine, 5cc total.  Entire aspect of the nail was elevated using nail elevator, removed using hemostats and traction force.  Nail bed was ablated using curettage and phenol followed by copious alcohol wash.  No complications.  Minimal bleeding.  Sterile bandage applied and post procedure instructions given.  Patient tolerated procedure well without complications.   buprofen and Tylenol for pain.   TD immunization provided.   No indication for antibiotics.   Call if pain, erythema fever or bleeding. Wound care and dressing instructions are given.

## 2012-10-21 NOTE — Progress Notes (Signed)
  [facility]  Patient name: Sylvia Hernandez MRN [id number]  Date of birth: [birth year]  CC & HPI:  Sylvia Hernandez is a 42 y.o. female presenting today for acutely painful first left toe.  Location  Left 1st toe  Onset  2 months ago but acutely worsened over past 2 days  Character  sharp pain with occasional throbbing especially when elevated  Severity  8/10   Temporal  all the time  Alleviating  nothing  Aggrivating  touching it and, shoes     ROS:  No fevers, no chills, no paresthesias, no claudication  Pertinent History Reviewed:  Medical & Surgical Hx:  Reviewed: Significant for prior toe nail partial removal Medications: Reviewed & Updated - see associated section Social History: Reviewed -  reports that she has never smoked. She has never used smokeless tobacco.  Objective Findings:  Vitals: BP 117/82  Pulse 88  Temp(Src) 98.9 F (37.2 C) (Oral)  Ht 5\' 2"  (1.575 m)  Wt 151 lb (68.493 kg)  BMI 27.61 kg/m2  PE: GENERAL:  Adult  female. In no discomfort; no respiratory distress. Extremities:  Bilateral feet with no edema, dorsalis pedis and posterior tibialis pulses bilaterally 1+ out of 4.  Diffusely capillary. refill is less than 2 seconds however over the distal first left toe it is delayed to greater than 4 seconds.  Toenail on the distal left first toe is markedly curved there is surrounding ear edema and exquisite tenderness. no fluctuance, no streaking, erythema, no exudate.  Significant curvature of Bilateral aspects of nail border.     Assessment & Plan:

## 2012-11-11 ENCOUNTER — Ambulatory Visit (INDEPENDENT_AMBULATORY_CARE_PROVIDER_SITE_OTHER): Payer: 59 | Admitting: Family Medicine

## 2012-11-11 ENCOUNTER — Encounter: Payer: Self-pay | Admitting: Family Medicine

## 2012-11-11 VITALS — BP 112/83 | HR 97 | Temp 98.1°F | Ht 62.0 in | Wt 150.0 lb

## 2012-11-11 DIAGNOSIS — J029 Acute pharyngitis, unspecified: Secondary | ICD-10-CM

## 2012-11-11 NOTE — Assessment & Plan Note (Signed)
Neg strep.  Discussed supportive care.

## 2012-11-11 NOTE — Progress Notes (Signed)
  Subjective:    Patient ID: Sylvia Hernandez, female    DOB: 02-Oct-1970, 42 y.o.   MRN: [id number]  HPI Workin appt for 2-3 days of sore throat  With mild headache, no fever, sneezing, coughing, dyspnea, emesis, diarrhea. No trouble swallowing, but pain with swallowing  Has tried tylenol and heerbal teas  Review of Systemssee HPI    Objective:   Physical Exam GEN: Alert & Oriented, No acute distress, well appearing HEENT: [name]/AT. EOMI, PERRLA, no conjunctival injection or scleral icterus.  Bilateral tympanic membranes intact without erythema or effusion.  .  Nares without edema or rhinorrhea.  Oropharynx is without exudates. Has some erythema.  No anterior or posterior cervical lymphadenopathy. CV:  Regular Rate & Rhythm, no murmur Respiratory:  Normal work of breathing, CTAB         Assessment & Plan:

## 2012-11-11 NOTE — Patient Instructions (Addendum)
Cepacol lozenges, Chloraspetic spray are numbing Try cold beverages Take 2 aleve twice a day as needed  Viral Pharyngitis Viral pharyngitis is a viral infection that produces redness, pain, and swelling (inflammation) of the throat. It can spread from person to person (contagious). CAUSES Viral pharyngitis is caused by inhaling a large amount of certain germs called viruses. Many different viruses cause viral pharyngitis. SYMPTOMS Symptoms of viral pharyngitis include:  Sore throat.  Tiredness.  Stuffy nose.  Low-grade fever.  Congestion.  Cough. TREATMENT Treatment includes rest, drinking plenty of fluids, and the use of over-the-counter medication (approved by your caregiver). HOME CARE INSTRUCTIONS   Drink enough fluids to keep your urine clear or pale yellow.  Eat soft, cold foods such as ice cream, frozen ice pops, or gelatin dessert.  Gargle with warm salt water (1 tsp salt per 1 qt of water).  If over age 63, throat lozenges may be used safely.  Only take over-the-counter or prescription medicines for pain, discomfort, or fever as directed by your caregiver. Do not take aspirin. To help prevent spreading viral pharyngitis to others, avoid:  Mouth-to-mouth contact with others.  Sharing utensils for eating and drinking.  Coughing around others. SEEK MEDICAL CARE IF:   You are better in a few days, then become worse.  You have a fever or pain not helped by pain medicines.  There are any other changes that concern you. Document Released: 04/25/2005 Document Revised: 10/08/2011 Document Reviewed: 09/21/2010 [facility] Patient Information 2013 [place], [place].

## 2013-01-20 ENCOUNTER — Encounter: Payer: Self-pay | Admitting: Obstetrics & Gynecology

## 2013-01-23 ENCOUNTER — Ambulatory Visit (INDEPENDENT_AMBULATORY_CARE_PROVIDER_SITE_OTHER): Payer: 59 | Admitting: Obstetrics & Gynecology

## 2013-01-23 ENCOUNTER — Encounter: Payer: Self-pay | Admitting: Obstetrics & Gynecology

## 2013-01-23 VITALS — BP 118/78 | HR 64 | Resp 16 | Ht 63.25 in | Wt 152.4 lb

## 2013-01-23 DIAGNOSIS — Z01419 Encounter for gynecological examination (general) (routine) without abnormal findings: Secondary | ICD-10-CM

## 2013-01-23 DIAGNOSIS — Z0000 Encounter for general adult medical examination without abnormal findings: Secondary | ICD-10-CM

## 2013-01-23 MED ORDER — DULOXETINE HCL 30 MG PO CPEP: 30.0000 mg | ORAL_CAPSULE | Freq: Every day | ORAL | Status: DC

## 2013-01-23 NOTE — Progress Notes (Signed)
42 y.o. G1P1001 MarriedAsianF here for annual exam.  Doing well.  Daughter going into 8th grade.  Going to [place].  Will be there during the 4th of July.  Major issue is carpel tunnel on left hand.  Does have some lymphedema on right side after surgery.  Does have some abdominal wall pain since mastectomy.  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  Took Coumadin    Patient's last menstrual period was 07/30/2005.          Sexually active: yes  The current method of family planning is status post hysterectomy.    Exercising: no  not regularly Smoker:  no  Health Maintenance: Pap:  08/25/07 WNL History of abnormal Pap:  no MMG:  10/12/10 normal ? If needs any future f/u Colonoscopy:  none BMD:   none TDaP:  2014 Screening Labs: here, Hb today: will return, Urine today: no sample given today   reports that she has never smoked. She has never used smokeless tobacco. She reports that she does not drink alcohol or use illicit drugs.  Past Medical History  Diagnosis Date  . GERD (gastroesophageal reflux disease)   . Migraine   . Allergy   . FHx: BRCA2 gene positive   . History of blood clots   . Meniere's disease   . Hearing loss   . Asthma     hospitalized frequently as child, now well controlled  . ECZEMA, ATOPIC DERMATITIS 09/26/2006    Qualifier: Diagnosis of  By: Bradly Bienenstock    . Adenomyosis   . H/O breast biopsy 4/11    fibrocystic changes  . H/O mastectomy 7/12    postop DVT hematoma on anticoagulation  . 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 DVT (deep venous thrombosis) 7/12    post op DVT  . BRCA2 positive   . Lymphedema     right hand  . Carpal tunnel syndrome     left hand    Past Surgical History  Procedure Laterality Date  . Tympanomastoidectomy  07/18/07  . Ovary surgery      removal of ovaries due to BRCA2 gene positive  . Cesarean section  03/20/01  . Breast surgery      bil masty  . Abdominal hysterectomy  02/12/06   Right Salpingectomy   . Inner ear surgery Left 2008    due to recurrent infection  . Laparoscopy  12/4    BSO    Current Outpatient Prescriptions  Medication Sig Dispense Refill  . acetaminophen (TYLENOL) 500 MG tablet Take 500 mg by mouth as needed for pain.      . naproxen sodium (ANAPROX) 220 MG tablet Take 220 mg by mouth as needed.      Marland Kitchen albuterol (VENTOLIN HFA) 108 (90 BASE) MCG/ACT inhaler Inhale 2 puffs into the lungs daily as needed.        No current facility-administered medications for this visit.    Family History  Problem Relation Age of Onset  . Prostate cancer Father   . Cancer Father     prostate  . Breast cancer Sister   . Cancer Sister 63    breast  . Hypothyroidism Sister   . Breast cancer Sister   . Cancer Sister 39    breast  . Hypothyroidism Sister   . Breast cancer Sister   . Thyroid disease Sister   . Cancer Sister 106  . Diabetes Brother   .  Thyroid disease Brother   . Hypothyroidism Brother   . Cancer Sister 64    ROS:  Pertinent items are noted in HPI.  Otherwise, a comprehensive ROS was negative.  Exam:   BP 118/78  Pulse 64  Resp 16  Ht 5' 3.25" (1.607 m)  Wt 152 lb 6.4 oz (69.128 kg)  BMI 26.77 kg/m2  LMP 07/30/2005  Weight change:-+5lbs  Height: 5' 3.25" (160.7 cm)  Ht Readings from Last 3 Encounters:  01/23/13 5' 3.25" (1.607 m)  11/11/12 5\' 2"  (1.575 m)  10/20/12 5\' 2"  (1.575 m)    General appearance: alert, cooperative and appears stated age Head: Normocephalic, without obvious abnormality, atraumatic Neck: no adenopathy, supple, symmetrical, trachea midline and thyroid normal to inspection and palpation Lungs: clear to auscultation bilaterally Breasts: absent breasts, tissue expander on left.  No palpable masses. Heart: regular rate and rhythm Abdomen: soft, non-tender; bowel sounds normal; no masses,  no organomegaly Extremities: extremities normal, atraumatic, no cyanosis or edema Skin: Skin color, texture, turgor  normal. No rashes or lesions Lymph nodes: Cervical, supraclavicular, and axillary nodes normal. No abnormal inguinal nodes palpated Neurologic: Grossly normal   Pelvic: External genitalia:  no lesions              Urethra:  normal appearing urethra with no masses, tenderness or lesions              Bartholins and Skenes: normal                 Vagina: normal appearing vagina with normal color and discharge, no lesions              Cervix: absent              Pap taken: no Bimanual Exam:  Uterus:  uterus absent              Adnexa: no mass, fullness, tenderness               Rectovaginal: Confirms               Anus:  normal sphincter tone, no lesions  A:  Well Woman with normal exam H/O Bilateral mastectomy due to BRCA 2+ testing H/O TAH, then BSO S/P DVT after mastectomy, negative w/u, on coumadin x 6 months. Asthma Strong family hx of breast cancer (4 sisters)  P:   No mammogram recommended.  Patient will see Dr. Welton Flakes again this year pap smear not indicated Trial of cymbalta 30mg  qd, increase after two weeks to 60mg  qday. Return for fasting lipids, CMP Pt promises to call Dr. Odis Luster for recommendation regarding tissue expander return annually or prn  An After Visit Summary was printed and given to the patient.

## 2013-01-23 NOTE — Patient Instructions (Signed)

## 2013-02-09 ENCOUNTER — Other Ambulatory Visit (INDEPENDENT_AMBULATORY_CARE_PROVIDER_SITE_OTHER): Payer: 59

## 2013-02-09 DIAGNOSIS — Z0000 Encounter for general adult medical examination without abnormal findings: Secondary | ICD-10-CM

## 2013-02-09 LAB — COMPREHENSIVE METABOLIC PANEL
Albumin: 4.9 g/dL (ref 3.5–5.2)
BUN: 14 mg/dL (ref 6–23)
CO2: 29 mEq/L (ref 19–32)
Calcium: 9.5 mg/dL (ref 8.4–10.5)
Chloride: 101 mEq/L (ref 96–112)
Glucose, Bld: 100 mg/dL — ABNORMAL HIGH (ref 70–99)
Potassium: 4.3 mEq/L (ref 3.5–5.3)

## 2013-02-09 LAB — LIPID PANEL
Cholesterol: 252 mg/dL — ABNORMAL HIGH (ref 0–200)
HDL: 47 mg/dL (ref >39)
Triglycerides: 115 mg/dL (ref <150)

## 2013-02-11 ENCOUNTER — Telehealth: Payer: Self-pay

## 2013-02-11 NOTE — Telephone Encounter (Signed)
Message copied by Elisha Headland on Wed Feb 11, 2013  8:42 AM ------      Message from: Jerene Bears      Created: Tue Feb 10, 2013 10:19 AM       Inform CMP nl.  Lipids elevated with LDLs 183.  Needs treatment.  Does she have a PCP? ------

## 2013-02-11 NOTE — Telephone Encounter (Signed)
7/16 [email or web address]//[id number] 

## 2013-02-20 NOTE — Telephone Encounter (Signed)
Patient notified of all results. 

## 2013-02-20 NOTE — Telephone Encounter (Signed)
Patient returning [facility] call re: results.

## 2013-06-04 ENCOUNTER — Other Ambulatory Visit: Payer: Self-pay

## 2013-10-28 ENCOUNTER — Ambulatory Visit (INDEPENDENT_AMBULATORY_CARE_PROVIDER_SITE_OTHER): Payer: 59 | Admitting: Family Medicine

## 2013-10-28 ENCOUNTER — Encounter: Payer: Self-pay | Admitting: Family Medicine

## 2013-10-28 ENCOUNTER — Ambulatory Visit (HOSPITAL_COMMUNITY)
Admission: RE | Admit: 2013-10-28 | Discharge: 2013-10-28 | Disposition: A | Discharge status: Home / Self Care | Payer: 59 | Source: Ambulatory Visit | Attending: Family Medicine | Admitting: Family Medicine

## 2013-10-28 VITALS — BP 128/80 | HR 95 | Temp 98.0°F | Wt 140.8 lb

## 2013-10-28 DIAGNOSIS — R079 Chest pain, unspecified: Secondary | ICD-10-CM | POA: Insufficient documentation

## 2013-10-28 DIAGNOSIS — K047 Periapical abscess without sinus: Secondary | ICD-10-CM

## 2013-10-28 DIAGNOSIS — Z1501 Genetic susceptibility to malignant neoplasm of breast: Secondary | ICD-10-CM

## 2013-10-28 DIAGNOSIS — Z1509 Genetic susceptibility to other malignant neoplasm: Secondary | ICD-10-CM

## 2013-10-28 LAB — POCT H PYLORI SCREEN: H PYLORI SCREEN, POC: NEGATIVE

## 2013-10-28 MED ORDER — CLINDAMYCIN HCL 300 MG PO CAPS: 300.0000 mg | ORAL_CAPSULE | Freq: Three times a day (TID) | ORAL | Status: DC

## 2013-10-28 MED ORDER — OMEPRAZOLE 20 MG PO CPDR: 20.0000 mg | DELAYED_RELEASE_CAPSULE | Freq: Every day | ORAL | Status: DC

## 2013-10-28 NOTE — Patient Instructions (Addendum)
Good to meet you today.  For your chest pain, this seems less likely to be your heart and more likely acid reflux and/or your tissue expander. - We are testing h pylori today because it is so severe. - Start omeprazole. - Keep avoiding caffeine, carbonated drinks, and not eating close to bedtime. - I am referring you to cardiology. - Follow up with Dr Ardelia Mems in 2 weeks. - If you have sudden worsening of your shortness of breath, seek immediate care.  For your gum swelling: - You very much need to see a dentist. - Take clindamycin for 10 days. - See [place] or a dentist later this week as it looks like there is a spot that will soon need to be drained. - Continue brushing and flossing regularly. Also swish with a mouthwash that does not burn. - Use tylenol for pain since you are allergic to stronger medications. If you develop worsened pain, we can try a numbing mouthwash - just call.   Best.  Hilton Sinclair, MD  Chest Pain (Nonspecific) Chest pain has many causes. Your pain could be caused by something serious, such as a heart attack or a blood clot in the lungs. It could also be caused by something less serious, such as a chest bruise or a virus. Follow up with your doctor. More lab tests or other studies may be needed to find the cause of your pain. Most of the time, nonspecific chest pain will improve within 2 to 3 days of rest and mild pain medicine. HOME CARE  For chest bruises, you may put ice on the sore area for 15-20 minutes, 03-04 times a day. Do this only if it makes you feel better.  Put ice in a plastic bag.  Place a towel between the skin and the bag.  Rest for the next 2 to 3 days.  Go back to work if the pain improves.  See your doctor if the pain lasts longer than 1 to 2 weeks.  Only take medicine as told by your doctor.  Quit smoking if you smoke. GET HELP RIGHT AWAY IF:   There is more pain or pain that spreads to the arm, neck, jaw, back, or belly  (abdomen).  You have shortness of breath.  You cough more than usual or cough up blood.  You have very bad back or belly pain, feel sick to your stomach (nauseous), or throw up (vomit).  You have very bad weakness.  You pass out (faint).  You have a fever. Any of these problems may be serious and may be an emergency. Do not wait to see if the problems will go away. Get medical help right away. Call your local emergency services 911 in [place] Do not drive yourself to the hospital. MAKE SURE YOU:   Understand these instructions.  Will watch this condition.  Will get help right away if you or your child is not doing well or gets worse. Document Released: 01/02/2008 Document Revised: 10/08/2011 Document Reviewed: 01/02/2008 [facility] Patient Information 2014 [place], [place].

## 2013-10-28 NOTE — Progress Notes (Addendum)
Patient ID: Sylvia Hernandez, female   DOB: April 08, 1971, 43 y.o.   MRN: [id number] Subjective:   CC: Chest pain/reflux, left gum swelling  HPI:   Chest pain/reflux - Pt reports 3 days of "central" chest pain that is nonexertional, random, and has 2 qualities. Burning pain occurs with lots of gas and burping and is worsened with food, especially even within 4 hours of bedtime. Had h/o reflux 4-5 years ago she states was associated with asthma. Was also associated with coffee which she stopped. Also reports a "tight, squeezing" chest pain with breathing and dyspnea that she thinks is due to tissue expander (s/p preventive bilateral mastectomies). Intermittent. Pt gets dizzy with tightness. Reports intermittent tachycardia at random. Occurred "a long time ago" but reports it was not worked up and provider at that time thought it was anxiety after stress test was normal (do not see record of this in chart). Pepto bismol and chamomile tea help. Denies leg swelling, fevers, chill, weight change. Reports cough with mild phlegm when dyspnea is bad, decreased PO due to reflux symptoms, urge to stool but inability to finish.  Gum swelling and pain - Patient has had a few days of gum swelling and left-sided headache and facial pain. Reports good dental hygiene (flosses/brushes twice daily) but has not seen dentist in "years" because has not found one she likes. Aleve does not help. Has had severe anaphylactic reaction to codeine in the past.   Review of Systems - Per HPI.   PMH: H/o empiric mastectomy with BRCA2 pos gene DVT 2012 after bilateral mastectomy (provoked) - Took lovenox bridge>>warfarin x 6 mo Reported internal bleeding after warfarin started  Meds: - Not taking cymbalta - took 1 and put her to sleep so she stopped it.    Objective:  Physical Exam BP 128/80  Pulse 95  Temp(Src) 98 F (36.7 C) (Oral)  Wt 140 lb 12.8 oz (63.866 kg)  SpO2 99%  LMP 07/30/2005 GEN: NAD HEENT: Atraumatic,  normocephalic/face symmetric with no focal swelling, neck supple, EOMI, sclera clear, gums swollen with mild erythema and plaque over teeth and gums, right upper gum with firm tender nodule, left lower gum with firm tender nodule near gumline, no fluctuance or purulence CV: RRR, no murmurs, rubs, or gallops PULM: CTAB, normal effort CHEST: Bilateral mastectomy scars, chest tender to palpation, no erythema or inflammation, right chest sunken with prior removal of muscle tissue ABD: Soft, nontender, nondistended, NABS, no organomegaly SKIN: No rash or cyanosis; warm and well-perfused; 2-3cm nonblanching light erythema left upper thigh EXTR: No lower extremity edema or calf tenderness PSYCH: Mood and affect euthymic, normal rate and volume of speech, mildly anxious-appearing NEURO: Awake, alert, no focal deficits grossly, normal speech  EKG: NSR, possible q waves II, III, aVF   Assessment:     Sylvia Hernandez is a 43 y.o. female here for eval of chest pain and gum pain.    Plan:     # See problem list and after visit summary for problem-specific plans. - Of note, patient stopped cymbalta. - Filling albuterol 1 inhaler but pt to f/u with PCP re: more refills.  Follow-up: - 1 week with [place] if you cannot get in to see dentist in 1 week. - 2 weeks for follow up of pain with Dr Sylvia Hernandez or sooner PRN.  Patient precepted with Dr Sylvia Hernandez.  Sylvia Sinclair, MD [place]

## 2013-10-29 DIAGNOSIS — K047 Periapical abscess without sinus: Secondary | ICD-10-CM | POA: Insufficient documentation

## 2013-10-29 DIAGNOSIS — R079 Chest pain, unspecified: Secondary | ICD-10-CM | POA: Insufficient documentation

## 2013-10-29 MED ORDER — ACETAMINOPHEN 500 MG PO TABS: 500.0000 mg | ORAL_TABLET | Freq: Three times a day (TID) | ORAL | Status: DC | PRN

## 2013-10-29 MED ORDER — ALBUTEROL SULFATE HFA 108 (90 BASE) MCG/ACT IN AERS: 2.0000 | INHALATION_SPRAY | Freq: Every day | RESPIRATORY_TRACT | Status: AC | PRN

## 2013-10-29 NOTE — Assessment & Plan Note (Signed)
DDx includes GERD/reflux esophagitis, pain from chest tissue stretcher, ACS (less likely given normal EKG in office), PE (h/o DVT but no h/o PE, no signs of DVT on exam, WELLS score 1.5). Vitals stable with normal O2 sat and breathing comfortably in clinic. Clear lungs. - Omeprazole. - Continue avoiding caffeine, carbonated drinks, and eating close to bedtime. - H pylori given severity of symptoms>>>Negative - Cardiology referral (appt made for ~2 weeks) - Consider removal of tissue expander early if symptoms worsen/become unbearable. Planned removal 01/13/14. - Return precautions reviewed. - F/u with PCP 1-2 weeks.

## 2013-10-29 NOTE — Assessment & Plan Note (Signed)
Gum swelling is most likely gingivitis, but it also appears she may be developing dental abscess. No fevers or fluctuance but tenderness on exam. Reports chronic nodules in mouth that hurt. No dentist. - Strongly encouraged dentist appt within 1-2 weeks. - Clindamycin 10 days. - Continue tylenol PRN pain. Limited due to anaphylactic reaction to codeine and fear of other similar narcotic. If pain unbearable, could try numbing mouthwash - if this develops prior to pt next being seen in 1-2 weeks, could prescribe over the phone. - If no dental appt next week, come back to [facility] clinic for I&D. - Continue regular hygiene. Add daily mouthwash. - Return precautions reviewed.

## 2013-11-02 ENCOUNTER — Telehealth: Payer: Self-pay | Admitting: Family Medicine

## 2013-11-02 NOTE — Telephone Encounter (Signed)
Called to check on patient. - She states that chest pressure continues but is much improved and she has no shortness of breath. She really feels it is due to the tissue expander. - Reflux has also gone down since she started taking omeprazole. - Gums feel much better since she started taking clindamycin, and she just feels left gum pain which is now minimal. o She is trying to make dental appt. Had hard time because of holiday but plans to call around today. - Return precautions reviewed.  Pt voiced understanding and appreciation of call.   Hilton Sinclair, MD

## 2013-11-10 ENCOUNTER — Encounter: Payer: Self-pay | Admitting: Cardiology

## 2013-11-10 ENCOUNTER — Ambulatory Visit (INDEPENDENT_AMBULATORY_CARE_PROVIDER_SITE_OTHER): Payer: 59 | Admitting: Cardiology

## 2013-11-10 VITALS — BP 130/86 | HR 80 | Ht 62.0 in | Wt 139.0 lb

## 2013-11-10 DIAGNOSIS — R0789 Other chest pain: Secondary | ICD-10-CM | POA: Insufficient documentation

## 2013-11-10 DIAGNOSIS — R079 Chest pain, unspecified: Secondary | ICD-10-CM

## 2013-11-10 NOTE — Patient Instructions (Signed)
The current medical regimen is effective;  continue present plan and medications.  Follow up as needed 

## 2013-11-10 NOTE — Progress Notes (Signed)
HPI The patient presents for evaluation of chest pain.  She has had an echocardiogram and stress test a few years ago which she said was normal.  She reports that she's been having chest discomfort off-and-on with reflux and asthma for a while. However, 2 weeks ago she had chest discomfort that was slightly different. She wondered if it could be related to her mastectomy that she has had with volume expanders in place. She reports that she was treated with her usual albuterol but he didn't improve. However, after taking Prilosec symptoms improved. She said the discomfort was somewhat burning and similar to previous reflux. She also has a sometimes mid sternal pressure. These are the symptoms that she's had in the past with reflux or asthma. Since she has been treated she said she's had no further symptoms. She doesn't exercise but she does do occasional things like dancing with her child. She does her usual activities of daily living and household chores. With that she's had no further cardiovascular symptoms. She's had no new shortness of breath, PND or orthopnea. She does feel her heart racing at times which seems to happen more at night but can happen otherwise. This she's had for years as well. She has not had any presyncope or syncope.  Allergies  Allergen Reactions  . Betadine [Povidone Iodine] Swelling  . Codeine Anaphylaxis, Shortness Of Breath and Other (See Comments)    Throat will also feel scratchy, and palpitations.  . Ibuprofen Palpitations  . Shellfish Allergy Palpitations and Other (See Comments)    Swelling of the mouth    Current Outpatient Prescriptions  Medication Sig Dispense Refill  . acetaminophen (TYLENOL) 500 MG tablet Take 1 tablet (500 mg total) by mouth every 8 (eight) hours as needed.  30 tablet  0  . albuterol (VENTOLIN HFA) 108 (90 BASE) MCG/ACT inhaler Inhale 2 puffs into the lungs daily as needed.  1 Inhaler  0  . naproxen sodium (ANAPROX) 220 MG tablet Take 220  mg by mouth as needed.      Marland Kitchen omeprazole (PRILOSEC) 20 MG capsule Take 1 capsule (20 mg total) by mouth daily.  45 capsule  0   No current facility-administered medications for this visit.    Past Medical History  Diagnosis Date  . GERD (gastroesophageal reflux disease)   . Migraine   . Allergy   . Meniere's disease   . Hearing loss   . Asthma     hospitalized frequently as child, now well controlled  . ECZEMA, ATOPIC DERMATITIS 09/26/2006    Qualifier: Diagnosis of  By: Beryle Lathe    . Adenomyosis   . H/O breast biopsy 4/11    fibrocystic changes  . DVT (deep venous thrombosis) 7/12    post op DVT  . BRCA2 positive   . Lymphedema     right hand  . Carpal tunnel syndrome     left hand    Past Surgical History  Procedure Laterality Date  . Tympanomastoidectomy  07/18/07  . Cesarean section  03/20/01  . Simple mastectomy  7/12    Bilateral  . Abdominal hysterectomy  02/12/06    Right Salpingectomy   . Laparoscopy  12/11    LSO    Family History  Problem Relation Age of Onset  . Prostate cancer Father   . Cancer Father     prostate  . Breast cancer Sister   . Cancer Sister 7    breast  . Hypothyroidism Sister   .  Breast cancer Sister   . Cancer Sister 71    breast  . Hypothyroidism Sister   . Breast cancer Sister   . Thyroid disease Sister   . Cancer Sister 57  . Diabetes Brother   . Thyroid disease Brother   . Hypothyroidism Brother   . Cancer Sister 43    History   Social History  . Marital Status: Married    Spouse Name: N/A    Number of Children: N/A  . Years of Education: N/A   Occupational History  . Not on file.   Social History Main Topics  . Smoking status: Never Smoker   . Smokeless tobacco: Never Used  . Alcohol Use: No  . Drug Use: No  . Sexual Activity: Yes    Birth Control/ Protection: Surgical     Comment: hysterectomy   Other Topics Concern  . Not on file   Social History Narrative   Native of [place]   Works  in the home.   43 year old daughter    ROS:  Positive for reflux, palpitations, leg rash.  Otherwise as stated in the HPI and negative for all other systems.  PHYSICAL EXAM BP 130/86  Pulse 80  Ht 5' 2" (1.575 m)  Wt 139 lb (63.05 kg)  BMI 25.42 kg/m2  LMP 07/30/2005 GENERAL:  Well appearing HEENT:  Pupils equal round and reactive, fundi not visualized, oral mucosa unremarkable NECK:  No jugular venous distention, waveform within normal limits, carotid upstroke brisk and symmetric, no bruits, no thyromegaly LYMPHATICS:  No cervical, inguinal adenopathy LUNGS:  Clear to auscultation bilaterally BACK:  No CVA tenderness CHEST:  Bilateral mastectomies. HEART:  PMI not displaced or sustained,S1 and S2 within normal limits, no S3, no S4, no clicks, no rubs, no murmurs ABD:  Flat, positive bowel sounds normal in frequency in pitch, no bruits, no rebound, no guarding, no midline pulsatile mass, no hepatomegaly, no splenomegaly EXT:  2 plus pulses throughout, no edema, no cyanosis no clubbing SKIN:  No rashes no nodules NEURO:  Cranial nerves II through XII grossly intact, motor grossly intact throughout PSYCH:  Cognitively intact, oriented to person place and time   EKG:  Sinus rhythm, rate 81, axis within normal limits, intervals within normal limits, no acute ST-T wave changes.10/28/2013  ASSESSMENT AND PLAN  CHEST PAIN: this is atypical. I think the pretest probability of obstructive coronary disease is very low. No further cardiovascular testing is indicated.  PALPITATIONS:  We discussed these at length. Since this is a stable pattern she will let me know in the future if it bothers her further and we will further evaluate.

## 2013-11-30 ENCOUNTER — Encounter: Payer: Self-pay | Admitting: Family Medicine

## 2013-11-30 ENCOUNTER — Ambulatory Visit (INDEPENDENT_AMBULATORY_CARE_PROVIDER_SITE_OTHER): Payer: 59 | Admitting: Family Medicine

## 2013-11-30 VITALS — BP 123/85 | HR 76 | Temp 98.0°F | Ht 62.0 in | Wt 142.0 lb

## 2013-11-30 DIAGNOSIS — R079 Chest pain, unspecified: Secondary | ICD-10-CM

## 2013-11-30 DIAGNOSIS — Z13220 Encounter for screening for lipoid disorders: Secondary | ICD-10-CM

## 2013-11-30 DIAGNOSIS — J45909 Unspecified asthma, uncomplicated: Secondary | ICD-10-CM

## 2013-11-30 DIAGNOSIS — K219 Gastro-esophageal reflux disease without esophagitis: Secondary | ICD-10-CM

## 2013-11-30 MED ORDER — OMEPRAZOLE 20 MG PO CPDR: 20.0000 mg | DELAYED_RELEASE_CAPSULE | Freq: Every day | ORAL | Status: DC

## 2013-11-30 NOTE — Patient Instructions (Signed)
It was nice to meet you today!  For reflux: I sent in refills on your prilosec.   For chest pain/palpitations: if these worsen or become problematic, call the cardiologist or our office for an appointment. If you have any chest pain that does not go away within 30 minutes, is accompanied by nausea, sweating, shortness of breath, or made worse by activity, go to the emergency room immediately for evaluation.  For health maintenance: On your way out, schedule an appointment one morning to come back for fasting labs. Do not eat or drink anything other than water the morning of your lab appointment until after your labs are drawn. Get your pap smear as scheduled with your OB/GYN doctor.  Schedule a separate appointment to discuss your hand/wrist.  Follow up in 6 months or sooner if you have any problems.  Be well, Dr. Ardelia Mems

## 2013-11-30 NOTE — Progress Notes (Signed)
Patient ID: Sylvia Hernandez, female   DOB: [birth year], 43 y.o.   MRN: [id number]  HPI:  F/u chest pain: Was seen by cardiology, who deemed her chest pain to be atypical and low risk, and advised no further workup. Has hx of BRCA2 positive with prophylactic mastectomy, and currently has in place a tissue expander, feels pressure from this, thinks that her pain is related to the tissue expander. No exertional pain. Cardiology also addressed her palpitations, and these are stable and not interrupting her daily routine so they did not pursue further workup on these either. She is to call cardiology if they worsen.   GERD: started prilosec 1 month ago. This is working very well and she has noticed a big improvement. Would like to continue this medicine.  Asthma: under control, uses albuterol very rarely.  ROS: See HPI  [place]: hx asthma, HLD, BRCA2+  PHYSICAL EXAM: BP 123/85  Pulse 76  Temp(Src) 98 F (36.7 C) (Oral)  Ht $R'5\' 2"'IW$  (1.575 m)  Wt 142 lb (64.411 kg)  BMI 25.97 kg/m2  LMP 07/30/2005 Gen: NAD HEENT: NCAT Heart: RRR, no murmurs Lungs: CTAB, NWOB Abdomen: soft, nontender to palpation, no masses or organomegaly Neuro: grossly nonfocal, speech normal  ASSESSMENT/PLAN:  # Health maintenance:  -will f/u with OB/GYN for pap smear -will return for fasting labs (lipids, cmet) as pt is nonfasting today. -UTD on other health maintenance items  See problem based charting for additional assessment/plan.  FOLLOW UP: F/u in 6 months for reflux. Note: pt also wanted to discuss hand/wrist pain, advised she schedule separate appt to discuss this since we were out of time for today's visit.  [place]. Ardelia Mems, [place]

## 2013-12-06 DIAGNOSIS — K219 Gastro-esophageal reflux disease without esophagitis: Secondary | ICD-10-CM | POA: Insufficient documentation

## 2013-12-06 NOTE — Assessment & Plan Note (Signed)
Mild intermittent. Rarely needs albuterol. Continue this prn.

## 2013-12-06 NOTE — Assessment & Plan Note (Addendum)
Likely related to GERD given improvement in sx's with prilosec, also suspect major component is presence of tissue expander. Was seen by cardiology who felt pain was atypical and did not advise further workup. Will continue to monitor. Discussed with pt reasons to go to ER.

## 2013-12-06 NOTE — Assessment & Plan Note (Signed)
Stable on prilosec. Continue this medicine. F/u in 6 months.

## 2014-01-02 ENCOUNTER — Other Ambulatory Visit: Payer: Self-pay | Admitting: Family Medicine

## 2014-01-14 HISTORY — PX: OTHER SURGICAL HISTORY: SHX169

## 2014-02-11 ENCOUNTER — Other Ambulatory Visit: Payer: 59

## 2014-02-11 DIAGNOSIS — Z13220 Encounter for screening for lipoid disorders: Secondary | ICD-10-CM

## 2014-02-11 LAB — COMPREHENSIVE METABOLIC PANEL
ALK PHOS: 88 U/L (ref 39–117)
ALT: 17 U/L (ref 0–35)
AST: 18 U/L (ref 0–37)
Albumin: 4.8 g/dL (ref 3.5–5.2)
BILIRUBIN TOTAL: 0.4 mg/dL (ref 0.2–1.2)
BUN: 17 mg/dL (ref 6–23)
CO2: 28 mEq/L (ref 19–32)
Calcium: 9.6 mg/dL (ref 8.4–10.5)
Chloride: 102 mEq/L (ref 96–112)
Creat: 0.87 mg/dL (ref 0.50–1.10)
GLUCOSE: 96 mg/dL (ref 70–99)
Potassium: 4.1 mEq/L (ref 3.5–5.3)
SODIUM: 139 meq/L (ref 135–145)
TOTAL PROTEIN: 7.3 g/dL (ref 6.0–8.3)

## 2014-02-11 LAB — CBC
HCT: 37.3 % (ref 36.0–46.0)
Hemoglobin: 13.2 g/dL (ref 12.0–15.0)
MCH: 29.1 pg (ref 26.0–34.0)
MCHC: 35.4 g/dL (ref 30.0–36.0)
MCV: 82.2 fL (ref 78.0–100.0)
PLATELETS: 251 10*3/uL (ref 150–400)
RBC: 4.54 MIL/uL (ref 3.87–5.11)
RDW: 14 % (ref 11.5–15.5)
WBC: 5.2 10*3/uL (ref 4.0–10.5)

## 2014-02-11 LAB — LIPID PANEL
CHOL/HDL RATIO: 4 ratio
Cholesterol: 253 mg/dL — ABNORMAL HIGH (ref 0–200)
HDL: 64 mg/dL (ref >39)
LDL Cholesterol: 173 mg/dL — ABNORMAL HIGH (ref 0–99)
Triglycerides: 80 mg/dL (ref <150)
VLDL: 16 mg/dL (ref 0–40)

## 2014-02-11 NOTE — Progress Notes (Signed)
CMP,CBC AND FLP DONE TODAY Sylvia Hernandez 

## 2014-02-24 ENCOUNTER — Encounter: Payer: Self-pay | Admitting: Family Medicine

## 2014-03-31 ENCOUNTER — Ambulatory Visit: Payer: 59 | Admitting: Obstetrics & Gynecology

## 2014-04-02 ENCOUNTER — Encounter: Payer: Self-pay | Admitting: Obstetrics & Gynecology

## 2014-04-02 ENCOUNTER — Ambulatory Visit (INDEPENDENT_AMBULATORY_CARE_PROVIDER_SITE_OTHER): Payer: 59 | Admitting: Obstetrics & Gynecology

## 2014-04-02 VITALS — BP 122/80 | HR 60 | Resp 16 | Ht 63.0 in | Wt 142.2 lb

## 2014-04-02 DIAGNOSIS — Z01419 Encounter for gynecological examination (general) (routine) without abnormal findings: Secondary | ICD-10-CM

## 2014-04-02 DIAGNOSIS — Z0000 Encounter for general adult medical examination without abnormal findings: Secondary | ICD-10-CM

## 2014-04-02 LAB — POCT URINALYSIS DIPSTICK
Bilirubin, UA: NEGATIVE
Blood, UA: NEGATIVE
Glucose, UA: NEGATIVE
KETONES UA: NEGATIVE
LEUKOCYTES UA: NEGATIVE
Nitrite, UA: NEGATIVE
PROTEIN UA: NEGATIVE
UROBILINOGEN UA: NEGATIVE
pH, UA: 5

## 2014-04-02 MED ORDER — CLOBETASOL PROPIONATE 0.05 % EX OINT: 1.0000 "application " | TOPICAL_OINTMENT | Freq: Two times a day (BID) | CUTANEOUS | Status: DC

## 2014-04-02 NOTE — Progress Notes (Signed)
43 y.o. G1P1001 MarriedAsianF here for annual exam.  Doing well.  Did have tissue expanders removed in June with Dr. Harlow Mares.  She has decided not to proceed with any additional reconstruction.  No vaginal bleeding.  Only complaint is recent onset of vulvar itching/irritation that pt feels is due to a change in toilet paper.  Has used OTC steroid with some improvement.  Now back with "old" toilet paper.  Patient's last menstrual period was 07/30/2005.          Sexually active: Yes.    The current method of family planning is status post hysterectomy.    Exercising: Yes.    walking Smoker:  no  Health Maintenance: Pap:  08/25/07 WNL History of abnormal Pap:  no MMG:  10/12/10-normal Colonoscopy:  none BMD:   none TDaP:  2014 Screening Labs: [place], Hb today: [place], Urine today: negative   reports that she has never smoked. She has never used smokeless tobacco. She reports that she does not drink alcohol or use illicit drugs.  Past Medical History  Diagnosis Date  . GERD (gastroesophageal reflux disease)   . Migraine   . Allergy   . Meniere's disease   . Hearing loss   . Asthma     hospitalized frequently as child, now well controlled  . ECZEMA, ATOPIC DERMATITIS 09/26/2006    Qualifier: Diagnosis of  By: Beryle Lathe    . Adenomyosis   . H/O breast biopsy 4/11    fibrocystic changes  . DVT (deep venous thrombosis) 7/12    post op DVT  . BRCA2 positive   . Lymphedema     right hand  . Carpal tunnel syndrome     left hand  . Hyperlipidemia     Past Surgical History  Procedure Laterality Date  . Tympanomastoidectomy  07/18/07  . Cesarean section  03/20/01  . Simple mastectomy  7/12    Bilateral  . Abdominal hysterectomy  02/12/06    Right Salpingectomy   . Laparoscopy  12/11    LSO  . Tissue expander removed  01/14/14    Current Outpatient Prescriptions  Medication Sig Dispense Refill  . albuterol (VENTOLIN HFA) 108 (90 BASE) MCG/ACT inhaler Inhale 2  puffs into the lungs daily as needed.  1 Inhaler  0  . omeprazole (PRILOSEC) 20 MG capsule Take 1 capsule (20 mg total) by mouth daily.  30 capsule  5   No current facility-administered medications for this visit.    Family History  Problem Relation Age of Onset  . Prostate cancer Father   . Cancer Father     prostate  . Breast cancer Sister   . Cancer Sister 1    breast  . Hypothyroidism Sister   . Breast cancer Sister   . Cancer Sister 66    breast  . Hypothyroidism Sister   . Breast cancer Sister   . Thyroid disease Sister   . Cancer Sister 82  . Diabetes Brother   . Thyroid disease Brother   . Hypothyroidism Brother   . Cancer Sister 82    ROS:  Pertinent items are noted in HPI.  Otherwise, a comprehensive ROS was negative.  Exam:   BP 122/80  Pulse 60  Resp 16  Ht _0  (1.6 m)  Wt 142 lb 3.2 oz (64.501 kg)  BMI 25.20 kg/m2  LMP 07/30/2005   Height: _1  (160 cm)  Ht Readings from Last 3 Encounters:  04/02/14 _2  (1.6 m)  11/30/13 _0  (1.575 m)  11/10/13 _1  (1.575 m)    General appearance: alert, cooperative and appears stated age Head: Normocephalic, without obvious abnormality, atraumatic Neck: no adenopathy, supple, symmetrical, trachea midline and thyroid normal to inspection and palpation Lungs: clear to auscultation bilaterally Breasts: bilateral absent breasts, well healed scars, no masses, no LAD Heart: regular rate and rhythm Abdomen: soft, non-tender; bowel sounds normal; no masses,  no organomegaly Extremities: extremities normal, atraumatic, no cyanosis or edema Skin: Skin color, texture, turgor normal. No rashes or lesions Lymph nodes: Cervical, supraclavicular, and axillary nodes normal. No abnormal inguinal nodes palpated Neurologic: Grossly normal   Pelvic: External genitalia:  no lesions              Urethra:  normal appearing urethra with no masses, tenderness or lesions              Bartholins and Skenes: normal                  Vagina: normal appearing vagina with normal color and discharge, no lesions              Cervix: absent              Pap taken: No. Bimanual Exam:  Uterus:  uterus absent              Adnexa: no mass, fullness, tenderness               Rectovaginal: Confirms               Anus:  normal sphincter tone, no lesions  A:  Well Woman with normal exam  H/O Bilateral mastectomy due to BRCA 2+ testing  H/O TAH, then BSO  S/P DVT after mastectomy, negative w/u, on coumadin x 6 months.  Asthma  GERD Strong family hx of breast cancer (4 sisters)  Elevated LDLs.  Working on diet and exercise. Vulvar irritation from new toilet paper use  P: No mammogram recommended. Released from oncology and breast surgeon (Dr. Marlou Starks)  Pap smear not indicated  Labs UPT with Dr. Dutch Quint, done in July. As pt has had complete hysterectomy and bilateral mastectomy, will start following her every other year.  Pt knows to call with any new issue/concern.   Clobetasol 0.05% ointment BID for up to 10 days.  Pt TCB if not fully resolved.  Rx to pharmacy.  An After Visit Summary was printed and given to the patient.

## 2014-05-31 ENCOUNTER — Encounter: Payer: Self-pay | Admitting: Obstetrics & Gynecology

## 2014-06-14 ENCOUNTER — Ambulatory Visit (INDEPENDENT_AMBULATORY_CARE_PROVIDER_SITE_OTHER): Payer: 59 | Admitting: *Deleted

## 2014-06-14 DIAGNOSIS — Z23 Encounter for immunization: Secondary | ICD-10-CM

## 2014-09-22 ENCOUNTER — Encounter: Payer: Self-pay | Admitting: Nurse Practitioner

## 2014-09-22 ENCOUNTER — Ambulatory Visit (INDEPENDENT_AMBULATORY_CARE_PROVIDER_SITE_OTHER): Payer: 59 | Admitting: Nurse Practitioner

## 2014-09-22 VITALS — BP 102/86 | HR 80 | Temp 98.1°F | Ht 63.0 in | Wt 147.0 lb

## 2014-09-22 DIAGNOSIS — R300 Dysuria: Secondary | ICD-10-CM

## 2014-09-22 DIAGNOSIS — N952 Postmenopausal atrophic vaginitis: Secondary | ICD-10-CM

## 2014-09-22 LAB — POCT URINALYSIS DIPSTICK
Bilirubin, UA: NEGATIVE
Glucose, UA: NEGATIVE
Ketones, UA: NEGATIVE
Leukocytes, UA: NEGATIVE
Nitrite, UA: NEGATIVE
Protein, UA: NEGATIVE
Urobilinogen, UA: NEGATIVE
pH, UA: 5

## 2014-09-22 MED ORDER — NITROFURANTOIN MONOHYD MACRO 100 MG PO CAPS: 100.0000 mg | ORAL_CAPSULE | Freq: Two times a day (BID) | ORAL | Status: DC

## 2014-09-22 MED ORDER — PHENAZOPYRIDINE HCL 200 MG PO TABS: 200.0000 mg | ORAL_TABLET | Freq: Three times a day (TID) | ORAL | Status: DC | PRN

## 2014-09-22 NOTE — Patient Instructions (Addendum)
Atrophic Vaginitis Atrophic vaginitis is a problem of low levels of estrogen in women. This problem can happen at any age. It is most common in women who have gone through menopause ("the change").  HOW WILL I KNOW IF I HAVE THIS PROBLEM? You may have:  Trouble with peeing (urinating), such as:  Going to the bathroom often.  A hard time holding your pee until you reach a bathroom.  Leaking pee.  Having pain when you pee.  Itching or a burning feeling.  Vaginal bleeding and spotting.  Pain during sex.  Dryness of the vagina.  A yellow, bad-smelling fluid (discharge) coming from the vagina. HOW WILL MY DOCTOR CHECK FOR THIS PROBLEM?  During your exam, your doctor will likely find the problem.  If there is a vaginal fluid, it may be checked for infection. HOW WILL THIS PROBLEM BE TREATED? Keep the vulvar skin as clean as possible. Moisturizers and lubricants can help with some of the symptoms. Estrogen replacement can help. There are 2 ways to take estrogen:  Systemic estrogen gets estrogen to your whole body. It takes many weeks or months before the symptoms get better.  You take an estrogen pill.  You use a skin patch. This is a patch that you put on your skin.  If you still have your uterus, your doctor may ask you to take a hormone. Talk to your doctor about the right medicine for you.  Estrogen cream.  This puts estrogen only at the part of your body where you apply it. The cream is put into the vagina or put on the vulvar skin. For some women, estrogen cream works faster than pills or the patch. CAN ALL WOMEN WITH THIS PROBLEM USE ESTROGEN? No. Women with certain types of cancer, liver problems, or problems with blood clots should not take estrogen. Your doctor can help you decide the best treatment for your symptoms. Document Released: 01/02/2008 Document Revised: 07/21/2013 Document Reviewed: 01/02/2008 [facility] Patient Information 2015 [place], [place]. This  information is not intended to replace advice given to you by your health care provider. Make sure you discuss any questions you have with your health care provider.   OTC Replens  OB tampons

## 2014-09-22 NOTE — Progress Notes (Signed)
44 y.o. Married Asian Fe G1P1 here with complaint of vaginal symptoms of burning that extends from the urethra to the inside of vagina. Not really any increased discharge but it is  Clear. No recent antibiotics.  Onset of symptoms 3-4 days ago after SA with onset of urethral pain yesterday. Denies new personal products but there is vaginal dryness.  She is This occurs with layering clothing and getting warm. She is S/P TAH with BSO 2011 and 2012 with simple mastectomy 2012 secondary to BRCA II positive. No STD concerns. Urinary symptoms - urethral discomfort with some urgency .  Last SA 3 days ago.   O:Healthy female WDWN Affect: normal, orientation x 3  Exam: no acute distress Abdomen: soft and non tender Lymph node: no enlargement or tenderness Pelvic exam: External genital: changes are consistent wit atrophy BUS: negative Vagina: only slight clear discharge noted. Affirm is taken Adnexa:normal,slight tender at the bladder, no masses or fullness noted  Affirm is done Urine Culture and Micro is done   A: R/O UTI  Urethral spasm  R/O vaginitis  Most likely cause of symptoms is atrophic vaginitis   P: Discussed findings of vaginal atrophy and etiology. Discussed Aveeno or baking soda sitz bath for comfort. Avoid moist clothes or pads for extended period of time. If working out in gym clothes or swim suits for long periods of time change underwear or bottoms of swimsuit if possible. Olive Oil use for skin protection prior to activity can be used to external skin.  Rx: Macrobid 100 mg BID #  14  Pyridium 200 mg 2-3 times day / prn  RV prn

## 2014-09-23 LAB — URINE CULTURE
COLONY COUNT: NO GROWTH
Organism ID, Bacteria: NO GROWTH

## 2014-09-23 LAB — WET PREP BY MOLECULAR PROBE
CANDIDA SPECIES: NEGATIVE
Gardnerella vaginalis: NEGATIVE
TRICHOMONAS VAG: NEGATIVE

## 2014-09-23 LAB — URINALYSIS, MICROSCOPIC ONLY
BACTERIA UA: NONE SEEN
CASTS: NONE SEEN
Crystals: NONE SEEN
Squamous Epithelial / LPF: NONE SEEN

## 2014-09-23 NOTE — Progress Notes (Signed)
Encounter reviewed by Dr. Tequisha Maahs Silva.  

## 2015-02-23 ENCOUNTER — Telehealth: Payer: Self-pay | Admitting: Family Medicine

## 2015-02-23 NOTE — Telephone Encounter (Signed)
Pt calling to get an appt for a Hep B vax for work. Sending message to make sure that this is needed. Thank you, Fonda Kinder, ASA

## 2015-02-23 NOTE — Telephone Encounter (Signed)
No record of previous Hep B.  Nurse visit appt for Hep B vaccine 02/24/15. Derl Barrow, RN

## 2015-02-24 ENCOUNTER — Ambulatory Visit (INDEPENDENT_AMBULATORY_CARE_PROVIDER_SITE_OTHER): Payer: 59 | Admitting: *Deleted

## 2015-02-24 DIAGNOSIS — Z23 Encounter for immunization: Secondary | ICD-10-CM | POA: Diagnosis not present

## 2015-03-28 ENCOUNTER — Ambulatory Visit (INDEPENDENT_AMBULATORY_CARE_PROVIDER_SITE_OTHER): Payer: 59 | Admitting: *Deleted

## 2015-03-28 DIAGNOSIS — Z23 Encounter for immunization: Secondary | ICD-10-CM

## 2015-05-10 ENCOUNTER — Ambulatory Visit: Payer: 59 | Admitting: Obstetrics & Gynecology

## 2016-06-04 ENCOUNTER — Ambulatory Visit (INDEPENDENT_AMBULATORY_CARE_PROVIDER_SITE_OTHER): Payer: 59 | Admitting: Obstetrics & Gynecology

## 2016-06-04 ENCOUNTER — Encounter: Payer: Self-pay | Admitting: Obstetrics & Gynecology

## 2016-06-04 VITALS — BP 118/70 | HR 80 | Resp 16 | Ht 62.75 in | Wt 141.0 lb

## 2016-06-04 DIAGNOSIS — M25552 Pain in left hip: Secondary | ICD-10-CM | POA: Diagnosis not present

## 2016-06-04 DIAGNOSIS — Z01419 Encounter for gynecological examination (general) (routine) without abnormal findings: Secondary | ICD-10-CM | POA: Diagnosis not present

## 2016-06-04 DIAGNOSIS — Z0000 Encounter for general adult medical examination without abnormal findings: Secondary | ICD-10-CM

## 2016-06-04 LAB — POCT URINALYSIS DIPSTICK
Bilirubin, UA: NEGATIVE
Blood, UA: NEGATIVE
Glucose, UA: NEGATIVE
KETONES UA: NEGATIVE
Leukocytes, UA: NEGATIVE
Nitrite, UA: NEGATIVE
PH UA: 5
PROTEIN UA: NEGATIVE
UROBILINOGEN UA: NEGATIVE

## 2016-06-04 NOTE — Progress Notes (Signed)
45 y.o. G1P1001 MarriedAsianF here for annual exam.  Doing well.  Daughter is a Administrator, arts in high school.  Doing early college.  Interested in going into the nursing program.    Denies vaginal bleeding.    Did blood work and has physical exam with [facility] later this week.  She hasn't gotten results of blood work yet.  Reports she has intermittent left hip pain. Often what triggers the pain is getting out of her car or certain movements of her left hip when standing up from a standing position  Once the pain get started, it will eventually go down the back of her leg.  Never has buttocks pain.  Uses Aleve and Tylenol and eventually it will get better with restriction of motion and movement.  When the pain is severe, she will have low back pain on the left side.  She did have a hip x ray in 2013.  Has been more bothersome.  She would like to pursue this further this year.  One of her sisters died in 06-08-23.  She has another sister who is in remission.  Pt also has a niece that was recently diagnosed.    Patient's last menstrual period was 07/30/2005.          Sexually active: Yes.    The current method of family planning is status post hysterectomy.    Exercising: No.  The patient does not participate in regular exercise at present. Smoker:  no  Health Maintenance: Pap:  2009 normal  History of abnormal Pap:  no MMG:  10/12/10-normal - patient had Bilateral Mastectomy Colonoscopy:  none BMD:   none TDaP:  2014  Screening Labs: Lipids/glucose done 05/31/16 - [facility], Hb today: PCP, Urine today: negative   reports that she has never smoked. She has never used smokeless tobacco. She reports that she does not drink alcohol or use drugs.  Past Medical History:  Diagnosis Date  . Adenomyosis   . Allergy   . Asthma    hospitalized frequently as child, now well controlled  . BRCA2 positive   . Carpal tunnel syndrome    left hand  . DVT (deep venous thrombosis) 7/12   post op DVT  .  ECZEMA, ATOPIC DERMATITIS 09/26/2006   Qualifier: Diagnosis of  By: Beryle Lathe    . GERD (gastroesophageal reflux disease)   . H/O breast biopsy 4/11   fibrocystic changes  . Hearing loss   . Hyperlipidemia   . Lymphedema    right hand  . Meniere's disease   . Migraine     Past Surgical History:  Procedure Laterality Date  . ABDOMINAL HYSTERECTOMY  02/12/06   Right Salpingectomy   . CESAREAN SECTION  03/20/01  . LAPAROSCOPY  12/11   BSO  . OOPHORECTOMY Bilateral 2011   BRCA II postive  . SIMPLE MASTECTOMY  7/12   Bilateral  . tissue expander removed  01/14/14   Dr. Harlow Mares  . TYMPANOMASTOIDECTOMY  07/18/07    Current Outpatient Prescriptions  Medication Sig Dispense Refill  . albuterol (VENTOLIN HFA) 108 (90 BASE) MCG/ACT inhaler Inhale 2 puffs into the lungs daily as needed. 1 Inhaler 0  . nitrofurantoin, macrocrystal-monohydrate, (MACROBID) 100 MG capsule Take 1 capsule (100 mg total) by mouth 2 (two) times daily. 14 capsule 0  . phenazopyridine (PYRIDIUM) 200 MG tablet Take 1 tablet (200 mg total) by mouth 3 (three) times daily as needed for pain. 30 tablet 0   No current facility-administered medications for  this visit.     Family History  Problem Relation Age of Onset  . Prostate cancer Father   . Cancer Father     prostate  . Breast cancer Sister   . Cancer Sister 31    breast  . Hypothyroidism Sister   . Breast cancer Sister   . Cancer Sister 55    breast  . Hypothyroidism Sister   . Breast cancer Sister   . Thyroid disease Sister   . Cancer Sister 42  . Diabetes Brother   . Thyroid disease Brother   . Hypothyroidism Brother   . Cancer Sister 43    ROS:  Pertinent items are noted in HPI.  Otherwise, a comprehensive ROS was negative.  Exam:   Vitals:   06/04/16 1018  BP: 118/70  Pulse: 80  Resp: 16   General appearance: alert, cooperative and appears stated age Head: Normocephalic, without obvious abnormality, atraumatic Neck: no  adenopathy, supple, symmetrical, trachea midline and thyroid normal to inspection and palpation Lungs: clear to auscultation bilaterally Breasts: surgically absent breasts, no masses, no LAD.  Well healed scars. Heart: regular rate and rhythm Abdomen: soft, non-tender; bowel sounds normal; no masses,  no organomegaly Extremities: extremities normal, atraumatic, no cyanosis or edema Skin: Skin color, texture, turgor normal. No rashes or lesions Lymph nodes: Cervical, supraclavicular, and axillary nodes normal. No abnormal inguinal nodes palpated Neurologic: Grossly normal   Pelvic: External genitalia:  no lesions              Urethra:  normal appearing urethra with no masses, tenderness or lesions              Bartholins and Skenes: normal                 Vagina: normal appearing vagina with normal color and discharge, no lesions              Cervix: absent              Pap taken: No. Bimanual Exam:  Uterus:  uterus absent              Adnexa: no mass, fullness, tenderness               Rectovaginal: Confirms               Anus:  normal sphincter tone, no lesions  Chaperone was present for exam.  A:   Well Woman with normal exam  H/O Bilateral mastectomy due to BRCA 2+ testing  H/O TAH, then BSO  S/P DVT after mastectomy, negative w/u, on coumadin x 6 months Asthma  GERD  Strong family hx of breast cancer (3 sisters)  Elevated LDLs Left hip pain  P:  Mammogram not indicated. Released from oncology and breast surgeon (Dr. Marlou Starks)  Pap smear not indicated  Labs done recently with PCP.  Has appt later this week. Referral to Dr. Lynann Bologna placed today Return two years.  Pt desires to continue having follow up but knows to call with any changes in symptoms

## 2016-07-31 DIAGNOSIS — R002 Palpitations: Secondary | ICD-10-CM | POA: Diagnosis not present

## 2016-08-07 DIAGNOSIS — E7800 Pure hypercholesterolemia, unspecified: Secondary | ICD-10-CM | POA: Diagnosis not present

## 2016-08-07 DIAGNOSIS — R002 Palpitations: Secondary | ICD-10-CM | POA: Diagnosis not present

## 2016-08-07 DIAGNOSIS — R0789 Other chest pain: Secondary | ICD-10-CM | POA: Diagnosis not present

## 2016-09-24 DIAGNOSIS — J45909 Unspecified asthma, uncomplicated: Secondary | ICD-10-CM | POA: Diagnosis not present

## 2016-09-24 DIAGNOSIS — Z885 Allergy status to narcotic agent status: Secondary | ICD-10-CM | POA: Diagnosis not present

## 2016-09-24 DIAGNOSIS — M25552 Pain in left hip: Secondary | ICD-10-CM | POA: Diagnosis not present

## 2016-10-10 DIAGNOSIS — M25552 Pain in left hip: Secondary | ICD-10-CM | POA: Diagnosis not present

## 2016-12-04 DIAGNOSIS — M25552 Pain in left hip: Secondary | ICD-10-CM | POA: Diagnosis not present

## 2017-01-28 DIAGNOSIS — L03032 Cellulitis of left toe: Secondary | ICD-10-CM | POA: Diagnosis not present

## 2017-01-28 DIAGNOSIS — B351 Tinea unguium: Secondary | ICD-10-CM | POA: Diagnosis not present

## 2017-02-01 DIAGNOSIS — B351 Tinea unguium: Secondary | ICD-10-CM | POA: Diagnosis not present

## 2017-02-07 DIAGNOSIS — L089 Local infection of the skin and subcutaneous tissue, unspecified: Secondary | ICD-10-CM | POA: Diagnosis not present

## 2017-02-18 ENCOUNTER — Telehealth: Payer: Self-pay | Admitting: Obstetrics & Gynecology

## 2017-02-18 ENCOUNTER — Other Ambulatory Visit: Payer: Self-pay | Admitting: *Deleted

## 2017-02-18 ENCOUNTER — Ambulatory Visit (INDEPENDENT_AMBULATORY_CARE_PROVIDER_SITE_OTHER): Payer: 59 | Admitting: Obstetrics & Gynecology

## 2017-02-18 ENCOUNTER — Encounter: Payer: Self-pay | Admitting: Obstetrics & Gynecology

## 2017-02-18 VITALS — BP 124/78 | HR 72 | Resp 12 | Wt 155.1 lb

## 2017-02-18 DIAGNOSIS — Z1509 Genetic susceptibility to other malignant neoplasm: Secondary | ICD-10-CM | POA: Diagnosis not present

## 2017-02-18 DIAGNOSIS — R0789 Other chest pain: Secondary | ICD-10-CM

## 2017-02-18 DIAGNOSIS — Z1501 Genetic susceptibility to malignant neoplasm of breast: Secondary | ICD-10-CM | POA: Diagnosis not present

## 2017-02-18 DIAGNOSIS — Z9013 Acquired absence of bilateral breasts and nipples: Secondary | ICD-10-CM

## 2017-02-18 DIAGNOSIS — Z803 Family history of malignant neoplasm of breast: Secondary | ICD-10-CM

## 2017-02-18 DIAGNOSIS — M79622 Pain in left upper arm: Secondary | ICD-10-CM | POA: Diagnosis not present

## 2017-02-18 NOTE — Progress Notes (Signed)
Patient scheduled while in office. Spoke with Cherish at [facility]. Scheduled for [place] left axilla on 02/20/17 arriving at 10:50am for 11:10 am appointment. Patient is agreeable to date and time.

## 2017-02-18 NOTE — Telephone Encounter (Signed)
Patient had a mastectomy in 2012 and is having some discomfort in the left chest area.

## 2017-02-18 NOTE — Progress Notes (Signed)
GYNECOLOGY  VISIT   HPI: 46 y.o. G1P1 Married Hispanic female here for complaint of left axillary swelling.  She has noticed what feels like a swollen area in her axilla.  She is concerned this may be lymphedema.  She is having some chest wall tightness and pulling as well.    Pt has known hx of BRCA 2 gene positivity with strong family history of breast cancer and had bilateral mastectomies done 7/12.  She considered having implants and had tissue expanders but were removed in 6/15 due to bleeding around the tissue expander.    She has gained 14 pounds since her last visit.    Pt has hx of lymphedema on the right side and states this feels like that on the left as well.  Wonders if this is possible after so many years.  GYNECOLOGIC HISTORY: Patient's last menstrual period was 07/30/2005. Contraception: hysterectomy Menopausal hormone therapy: none  Patient Active Problem List   Diagnosis Date Noted  . GERD (gastroesophageal reflux disease) 12/06/2013  . Chest pain 11/10/2013  . Dental abscess 10/29/2013  . Chest pain, unspecified 10/29/2013  . Ingrown toenail without infection 10/21/2012  . Hyperlipidemia 01/28/2012  . Carpal tunnel syndrome of left wrist 01/19/2012  . Left groin pain 11/12/2011  . Healthcare maintenance 11/12/2011  . Overweight(278.02) 11/12/2011  . Nail discoloration 11/12/2011  . BRCA2 positive 02/22/2011  . ASTHMA, INTERMITTENT 09/26/2006    Past Medical History:  Diagnosis Date  . Adenomyosis   . Allergy   . Asthma    hospitalized frequently as child, now well controlled  . BRCA2 positive   . Carpal tunnel syndrome    left hand  . DVT (deep venous thrombosis) ([place]) 7/12   post op DVT  . ECZEMA, ATOPIC DERMATITIS 09/26/2006   Qualifier: Diagnosis of  By: Beryle Lathe    . GERD (gastroesophageal reflux disease)   . H/O breast biopsy 4/11   fibrocystic changes  . Hearing loss   . Hyperlipidemia   . Lymphedema    right hand  . Meniere's  disease   . Migraine     Past Surgical History:  Procedure Laterality Date  . ABDOMINAL HYSTERECTOMY  02/12/06   Right Salpingectomy   . CESAREAN SECTION  03/20/01  . LAPAROSCOPY  12/11   BSO  . OOPHORECTOMY Bilateral 2011   BRCA II postive  . SIMPLE MASTECTOMY  7/12   Bilateral  . tissue expander removed  01/14/14   Dr. Harlow Mares  . TYMPANOMASTOIDECTOMY  07/18/07    MEDS:  Reviewed in EPIC and UTD  ALLERGIES: Betadine [povidone iodine]; Codeine; Ibuprofen; and Shellfish allergy  Family History  Problem Relation Age of Onset  . Prostate cancer Father   . Cancer Father        prostate  . Cancer Sister 8       breast.  Died age 21.  Marland Kitchen Hypothyroidism Sister   . Cancer Sister 46       breast.  Died age 36.  Marland Kitchen Hypothyroidism Sister   . Thyroid disease Sister   . Cancer Sister 69       breast.  In remission  . Cancer Sister 74       abdominal cancer diagnoed 2017  . Diabetes Brother   . Thyroid disease Brother   . Hypothyroidism Brother   . Hypertension Brother   . Breast cancer Other 59       sister's daugheter    SH:  Married, non smoker  Review  of Systems  Constitutional: Negative.   Musculoskeletal: Positive for myalgias (chest wall).  Neurological: Negative.     PHYSICAL EXAMINATION:    BP 124/78 (BP Location: Left Arm, Patient Position: Sitting, Cuff Size: Normal)   Pulse 72   Resp 12   Wt 155 lb 1.6 oz (70.4 kg)   LMP 07/30/2005   BMI 27.69 kg/m     Physical Exam  Constitutional: She is oriented to person, place, and time. She appears well-developed and well-nourished.  Cardiovascular: Normal rate and regular rhythm.   Respiratory: Effort normal and breath sounds normal. Right breast exhibits tenderness (with palpation of chest wall). Right breast exhibits no mass. Left breast exhibits tenderness (with palpation of chest wall). Breasts are asymmetrical.    Lymphadenopathy:       Right axillary: No pectoral and no lateral adenopathy present.        Left axillary: No pectoral and no lateral adenopathy present.      Right: No supraclavicular adenopathy present.       Left: No supraclavicular adenopathy present.  Neurological: She is alert and oriented to person, place, and time.  Skin: Skin is warm and dry.  Psychiatric: She has a normal mood and affect.   Assessment: Left axillary puffiness/swelling, possibly lympedema and possibly adipose tissue Strong family hx of breast cancer H/O BRCA 2 positive status H/O bilateral mastectomies  Plan: Axillary ultrasound will be obtained and follow-up will be planned after this is completed

## 2017-02-18 NOTE — Telephone Encounter (Signed)
Spoke with patient. Patient had a bilateral mastectomy in 2012. 3 sisters with breast cancer. BRCA II positive. Reports her left chest where her breast was has become very sensitive to the touch. Reports swelling to the area with cramping pain and multiple "bumps" that feel like they have a hard center. Denies any skin color changes. Noticing swelling extending to the right side as well. Uncomfortable to put arms down. Appointment scheduled for today at 1:45 pm with Dr.Miller. Patient is agreeable to date and time.  Routing to provider for final review. Patient agreeable to disposition. Will close encounter.

## 2017-02-20 ENCOUNTER — Ambulatory Visit
Admission: RE | Admit: 2017-02-20 | Discharge: 2017-02-20 | Disposition: A | Discharge status: Home / Self Care | Payer: 59 | Source: Ambulatory Visit | Attending: Obstetrics & Gynecology | Admitting: Obstetrics & Gynecology

## 2017-02-20 DIAGNOSIS — Z1589 Genetic susceptibility to other disease: Secondary | ICD-10-CM

## 2017-02-20 DIAGNOSIS — M79622 Pain in left upper arm: Secondary | ICD-10-CM

## 2017-02-20 DIAGNOSIS — Z9013 Acquired absence of bilateral breasts and nipples: Secondary | ICD-10-CM

## 2017-02-20 DIAGNOSIS — Z803 Family history of malignant neoplasm of breast: Secondary | ICD-10-CM

## 2017-02-20 DIAGNOSIS — Z1501 Genetic susceptibility to malignant neoplasm of breast: Secondary | ICD-10-CM

## 2017-02-20 DIAGNOSIS — M79602 Pain in left arm: Secondary | ICD-10-CM | POA: Diagnosis not present

## 2017-02-20 DIAGNOSIS — Z1509 Genetic susceptibility to other malignant neoplasm: Principal | ICD-10-CM

## 2017-02-21 ENCOUNTER — Encounter: Payer: Self-pay | Admitting: Obstetrics & Gynecology

## 2017-02-21 DIAGNOSIS — Z9013 Acquired absence of bilateral breasts and nipples: Secondary | ICD-10-CM | POA: Insufficient documentation

## 2017-02-21 DIAGNOSIS — Z803 Family history of malignant neoplasm of breast: Secondary | ICD-10-CM | POA: Insufficient documentation

## 2017-06-11 DIAGNOSIS — Z8709 Personal history of other diseases of the respiratory system: Secondary | ICD-10-CM | POA: Diagnosis not present

## 2017-06-11 DIAGNOSIS — Z0000 Encounter for general adult medical examination without abnormal findings: Secondary | ICD-10-CM | POA: Diagnosis not present

## 2017-06-11 DIAGNOSIS — E785 Hyperlipidemia, unspecified: Secondary | ICD-10-CM | POA: Diagnosis not present

## 2018-05-18 DIAGNOSIS — R51 Headache: Secondary | ICD-10-CM | POA: Diagnosis not present

## 2018-05-18 DIAGNOSIS — R202 Paresthesia of skin: Secondary | ICD-10-CM | POA: Diagnosis not present

## 2018-05-20 ENCOUNTER — Encounter: Payer: Self-pay | Admitting: *Deleted

## 2018-05-21 ENCOUNTER — Ambulatory Visit: Payer: 59 | Admitting: Neurology

## 2018-05-21 ENCOUNTER — Encounter: Payer: Self-pay | Admitting: Neurology

## 2018-05-21 VITALS — BP 119/92 | HR 65 | Ht 62.0 in | Wt 155.0 lb

## 2018-05-21 DIAGNOSIS — R29898 Other symptoms and signs involving the musculoskeletal system: Secondary | ICD-10-CM

## 2018-05-21 DIAGNOSIS — Z853 Personal history of malignant neoplasm of breast: Secondary | ICD-10-CM | POA: Diagnosis not present

## 2018-05-21 DIAGNOSIS — G8929 Other chronic pain: Secondary | ICD-10-CM

## 2018-05-21 DIAGNOSIS — R200 Anesthesia of skin: Secondary | ICD-10-CM

## 2018-05-21 DIAGNOSIS — M5481 Occipital neuralgia: Secondary | ICD-10-CM | POA: Diagnosis not present

## 2018-05-21 DIAGNOSIS — M542 Cervicalgia: Secondary | ICD-10-CM

## 2018-05-21 DIAGNOSIS — R51 Headache: Secondary | ICD-10-CM

## 2018-05-21 DIAGNOSIS — R519 Headache, unspecified: Secondary | ICD-10-CM

## 2018-05-21 DIAGNOSIS — R42 Dizziness and giddiness: Secondary | ICD-10-CM

## 2018-05-21 DIAGNOSIS — S161XXA Strain of muscle, fascia and tendon at neck level, initial encounter: Secondary | ICD-10-CM

## 2018-05-21 NOTE — Progress Notes (Signed)
[facility]    Provider:  Dr Jaynee Eagles Referring Provider: Dineen Kid MD Primary Care Physician:  Lennette Bihari Via MD  CC:  Dizziness and left arm sensory changes  HPI:  Sylvia Hernandez is a 47 y.o. female here as requested by Dr. Lynelle Doctor for dizziness. PMHx PVCs, asthma, HLD, GERD, CTS, Radial styloid tenosynovitis, neuropathic pain. She has tympanomastoidectomy due to infection and had surgery and since then has had dizziness and diagned with Meniere's disease by Dr. Thornell Mule. Ear pain started 3 years ago behind the left ear (points to the occipital area) it feels like swollen and pulsing, constant, it starts radiating from the occipital area to the neck arm gets numb. She has been evaluated by cardiology. She gets a layer over her left eye and she can't see with the episodes. She was evaluated by ophthalmology. May happen several times a year and can last weeks continuously. Currently she is experiencing the symptoms pain in the occipital area and radiates all the ay down her left arm. It hurts with pressure. Weakness and numbness in the left arm. Decreased ROM of the left neck. No other focal neurologic deficits, associated symptoms, inciting events or modifiable factors.  Reviewed notes, labs and imaging from outside physicians, which showed:  Reviewed referral notes from [facility] walk-in clinic Dr. Lynelle Doctor.  Patient reported pain in the lower left side of head, dizziness, nausea.  She had tried meclizine 25 mg 3 times a day.  Reported a severity of 9 out of 10.  Getting worse.  No exposure to other illnesses no exposure to strep or mono.  She has had similar symptoms like this in the past.  No recent travel.  She has a history of headaches and asthma.  Similar episode in 2017 dizziness falls left posterior pain.  Headache occipital in the left side.  Pain radiates down the left arm with weakness and numbness.  Exam was normal including inner ear exam, respiratory, cardiac, lungs, neurologic exam was  normal with normal cerebellar testing but she did have shooting pains with head flexion which was noted as limb meets sign.  She was referred here.  Labs were drawn in November 2018 which showed glucose 102, BUN 14, creatinine 0.95, otherwise normal CMP.  I see no recent labs.  Review of Systems: Patient complains of symptoms per HPI as well as the following symptoms: headache, numbness, weakness, dizziness. Pertinent negatives and positives per HPI. All others negative.   Social History   Socioeconomic History  . Marital status: Married    Spouse name: Not on file  . Number of children: 1  . Years of education: 38  . Highest education level: Bachelor's degree (e.g., BA, AB, BS)  Occupational History  . Not on file  Social Needs  . Financial resource strain: Not on file  . Food insecurity:    Worry: Not on file    Inability: Not on file  . Transportation needs:    Medical: Not on file    Non-medical: Not on file  Tobacco Use  . Smoking status: Never Smoker  . Smokeless tobacco: Never Used  Substance and Sexual Activity  . Alcohol use: Never    Frequency: Never  . Drug use: Never  . Sexual activity: Yes    Partners: Male    Birth control/protection: Surgical    Comment: hysterectomy  Lifestyle  . Physical activity:    Days per week: Not on file    Minutes per session: Not on file  .  Stress: Not on file  Relationships  . Social connections:    Talks on phone: Not on file    Gets together: Not on file    Attends religious service: Not on file    Active member of club or organization: Not on file    Attends meetings of clubs or organizations: Not on file    Relationship status: Not on file  . Intimate partner violence:    Fear of current or ex partner: Not on file    Emotionally abused: Not on file    Physically abused: Not on file    Forced sexual activity: Not on file  Other Topics Concern  . Not on file  Social History Narrative   Native of [place]   Works  in the home.   14 year old daughter   Lives at home with husband and daughter   Right handed   Caffeine: 2 cups daily    Family History  Problem Relation Age of Onset  . Prostate cancer Father   . Cancer Father        prostate, bone  . Cancer Sister 22       breast.  Died age 32.  Marland Kitchen Hypothyroidism Sister   . Breast cancer Sister   . Cancer Sister 38       breast.  Died age 75.  Marland Kitchen Hypothyroidism Sister   . Breast cancer Sister   . Thyroid disease Sister   . Cancer Sister 61       breast.  In remission  . Breast cancer Sister   . Cancer Sister 71       abdominal cancer diagnoed 2017  . Diabetes Brother   . Thyroid disease Brother   . Hypothyroidism Brother   . Hypertension Brother   . Breast cancer Other 32       sister's daugheter and paternal great aunts   . Breast cancer Sister        in remission    Past Medical History:  Diagnosis Date  . Adenomyosis   . Allergy   . Arthritis    left hip  . Asthma    hospitalized frequently as child, now well controlled  . BRCA2 positive   . Carpal tunnel syndrome    left hand  . Carpal tunnel syndrome   . DVT (deep venous thrombosis) ([place]) 7/12   post op DVT  . ECZEMA, ATOPIC DERMATITIS 09/26/2006   Qualifier: Diagnosis of  By: Beryle Lathe    . GERD (gastroesophageal reflux disease)   . GERD (gastroesophageal reflux disease)   . H/O breast biopsy 4/11   fibrocystic changes  . Hearing loss   . Hyperlipidemia   . Lymphedema    right arm  . Meniere's disease   . Migraine   . Neuropathic pain   . PVC's (premature ventricular contractions)     Past Surgical History:  Procedure Laterality Date  . ABDOMINAL HYSTERECTOMY  02/12/06   Right Salpingectomy   . CESAREAN SECTION  03/20/01  . LAPAROSCOPY  12/11   BSO  . MASTECTOMY Bilateral 2012   prophylactic BRCA 2 positive  . OOPHORECTOMY Bilateral 2011   BRCA II postive  . SIMPLE MASTECTOMY  7/12   Bilateral  . tissue expander removed  01/14/14   Dr. Harlow Mares    . TYMPANOMASTOIDECTOMY  07/18/07    Current Outpatient Medications  Medication Sig Dispense Refill  . albuterol (VENTOLIN HFA) 108 (90 BASE) MCG/ACT inhaler Inhale 2 puffs into  the lungs daily as needed. 1 Inhaler 0  . atorvastatin (LIPITOR) 10 MG tablet Take 1 tablet by mouth daily.    . indomethacin (INDOCIN) 50 MG capsule Take 50 mg by mouth 3 (three) times daily as needed.    . meclizine (ANTIVERT) 25 MG tablet Take 25 mg by mouth 3 (three) times daily as needed for dizziness.     No current facility-administered medications for this visit.     Allergies as of 05/21/2018 - Review Complete 05/21/2018  Allergen Reaction Noted  . Betadine [povidone iodine] Swelling 01/20/2013  . Codeine Anaphylaxis, Shortness Of Breath, and Other (See Comments) 04/06/2008  . Ibuprofen Palpitations 02/22/2011  . Shellfish allergy Palpitations and Other (See Comments) 02/01/2011    Vitals: BP (!) 119/92 (BP Location: Left Arm, Patient Position: Sitting)   Pulse 65   Ht '5\' 2"'$  (1.575 m)   Wt 155 lb (70.3 kg)   LMP 07/30/2005   BMI 28.35 kg/m  Last Weight:  Wt Readings from Last 1 Encounters:  05/21/18 155 lb (70.3 kg)   Last Height:   Ht Readings from Last 1 Encounters:  05/21/18 '5\' 2"'$  (1.575 m)   Physical exam: Exam: Gen: NAD, conversant            CV: RRR, no MRG. No Carotid Bruits. No peripheral edema, warm, nontender Eyes: Conjunctivae clear without exudates or hemorrhage MSK: decreased ROM of neck to the left, tight trapezius with pain on palpation, tenderness at the emergence of the greater and lesser occipital nerves.  Neuro: Detailed Neurologic Exam  Speech:    Speech is normal; fluent and spontaneous with normal comprehension.  Cognition:    The patient is oriented to person, place, and time;     recent and remote memory intact;     language fluent;     normal attention, concentration,     fund of knowledge Cranial Nerves:    The pupils are equal, round, and reactive to  light. Attempted fundoscopic exam could not visualize due to small pupils.  Visual fields are full to finger confrontation. Extraocular movements are intact. Trigeminal sensation is intact and the muscles of mastication are normal. The face is symmetric. The palate elevates in the midline. Hearing intact. Voice is normal. Shoulder shrug is normal. The tongue has normal motion without fasciculations.   Coordination:    Normal finger to nose and heel to shin. Normal rapid alternating movements.   Gait:    Heel-toe and tandem gait are normal.   Motor Observation:    No asymmetry, no atrophy, and no involuntary movements noted. Tone:    Normal muscle tone.    Posture:    Posture is normal. normal erect    Strength: left arm proximal weakness (may be due to pain).    Strength is V/V in the upper and lower limbs.      Sensation: intact to LT     Reflex Exam:  DTR's:    Deep tendon reflexes in the upper and lower extremities are brisk bilaterally.   Toes:    The toes are downgoing bilaterally.   Clonus:    2 beats clonus AJs    Assessment/Plan:  This is a lovely female with repeated episodes of dizziness, occipital left pain, radiation into the trapezius and left arm with associated weakness and numbness of left arm, decreased ROM of neck with neck pain on movement.  Exam shows left arm prox weakness, tightness left cervical muscles, Pain on palpation of left trapezius  and cervical muscles. Also pain on palpation at the emergence of the lesser and greater occipital nerves on the left, decreased ROM of neck.It may be musculoskeletal with occipital nerve entrapment however given other concerning symptoms needs evaluation.  MRI brain and cervical spine due to left arm numbness and weakness, chronic neck pain and radiculopathy, pain on palpation of cervical spine and cervical muscles, decreased ROM of neck, occipital head pain, dizziness,  reported Lheurmitte's sign at pcp appointment: Need  to  eval for MS or metastasis (HX of breast cancer), stroke, other intracranial lesions such as schwannomas, compressive masses.   PT: Occipital neuralgia and left-sided cervical myofascial pain  Will follow up for next steps after MRI brain and cervical spine, maybe more nerve blocks or oral treatment  Orders Placed This Encounter  Procedures  . MR BRAIN W WO CONTRAST  . MR CERVICAL SPINE W WO CONTRAST  . Ambulatory referral to Physical Therapy     Performed by Dr. Jaynee Eagles M.D. All procedures a documented blood were medically necessary, reasonable and appropriate based on the patient's history, medical diagnosis and physician opinion. Verbal informed consent was obtained from the patient, patient was informed of potential risk of procedure, including bruising, bleeding, hematoma formation, infection, muscle weakness, muscle pain, numbness, transient hypertension, transient hyperglycemia and transient insomnia among others. All areas injected were topically clean with isopropyl rubbing alcohol. Nonsterile nonlatex gloves were worn during the procedure.  1. Auriculotemporal nerve block (53614): The Auriculotemporal nerve site was identified along the posterior margin of the sternocleidomastoid muscle toward the base of the ear. Medication was injected into the left radicular temporal nerve areas. Patient's condition is associated with inflammation of the Auriculotemporal Nerve and associated muscle groups. Injection was deemed medically necessary, reasonable and appropriate. Injection represents a separate and unique surgical service. 2. 20552: occipitalis, cervical paraspinal    Discussed: To prevent or relieve headaches, try the following: Cool Compress. Lie down and place a cool compress on your head.  Avoid headache triggers. If certain foods or odors seem to have triggered your migraines in the past, avoid them. A headache diary might help you identify triggers.  Include physical activity in  your daily routine. Try a daily walk or other moderate aerobic exercise.  Manage stress. Find healthy ways to cope with the stressors, such as delegating tasks on your to-do list.  Practice relaxation techniques. Try deep breathing, yoga, massage and visualization.  Eat regularly. Eating regularly scheduled meals and maintaining a healthy diet might help prevent headaches. Also, drink plenty of fluids.  Follow a regular sleep schedule. Sleep deprivation might contribute to headaches Consider biofeedback. With this mind-body technique, you learn to control certain bodily functions - such as muscle tension, heart rate and blood pressure - to prevent headaches or reduce headache pain.    Proceed to emergency room if you experience new or worsening symptoms or symptoms do not resolve, if you have new neurologic symptoms or if headache is severe, or for any concerning symptom.   Provided education and documentation from American headache Society toolbox including articles on: chronic migraine medication overuse headache, chronic migraines, prevention of migraines, behavioral and other nonpharmacologic treatments for headache . Cc:Dr. Via  Sarina Ill, MD  [facility] [address] [place], [place] [address]  Phone [phone] Fax [phone]

## 2018-05-21 NOTE — Progress Notes (Signed)
Nerve block w/o steroid: Pt signed consent  0.5% Bupivocaine 1.5 mL LOT: [id number] EXP: 07/2019 NDC: 53967-289-79  2% Lidocaine 1.5 mL LOT: [id number] EXP: 02/28/2019 NDC: 1504-1364-38

## 2018-05-21 NOTE — Patient Instructions (Signed)
MRI of the brain and cervical spine Physical therapy  Peripheral Nerve Block What is a peripheral nerve block? A peripheral nerve block is a method of using a type of medicine that is injected into an area of the body to numb everything below the injection site (regional anesthetic). The medicine is injected around the nerve that provides feeling to the area where you will have a surgical procedure done. A peripheral nerve block is done so that you do not feel any pain during your procedure. You may be numb for up to 24 hours after your peripheral nerve block is done, depending on the type of medicine used. A peripheral nerve block can be extended to relieve pain for a longer period by leaving a thin tube (catheter) inserted near your nerve after your procedure. More numbing medicine can be given through the catheter for up to several days. What are some reasons for having a peripheral nerve block? You may have a peripheral nerve block to relieve pain associated with many types of procedures, such as surgery on any parts of your limbs, your hip, your shoulder, or your head. A peripheral nerve block may be done if you are not able to receive medicine to make you fall asleep (general anesthetic) during your procedure. What are the risks of a peripheral nerve block? Generally, peripheral nerve blocks are safe. However, problems may occur, including:  Infection at the injection site.  Bleeding.  Allergic reactions to medicines.  Damage to other structures or organs, such as the nerve that is being blocked. Nerve damage can be temporary or permanent.  Bruising.  Pain.  What are the benefits of a peripheral nerve block? The main benefit of a peripheral nerve block is that you will not feel pain during your procedure, and you will not be exposed to the risks associated with receiving a general anesthetic. Other benefits may include:  Reduced need for pain medicine after your procedure.  Fewer  side effects from pain medicine that you take after your procedure.  Lower risk of blood clots.  Faster recovery.  How is a peripheral nerve block performed?  An IV tube will be inserted into your hand or your arm.  You may be given a medicine to help you relax (sedative).  Your nerve may be located by using: ? An ultrasound. This is an imaging test that uses sound waves to create an image of the area. ? A nerve stimulator. This is a device that activates the nerve and causes your muscles to twitch.  The area near your injection site will be cleaned with a germ-killing (antiseptic) solution.  Medicine to numb your injection area (local anesthetic) may be injected into the tissue above your nerve.  Regional anesthetic will be injected into the area near your nerve. ? The medicine will be injected around the nerve, not into it. ? You should not feel any pain during this injection.  A catheter may be inserted through the needle and left near the nerve when the needle is removed.  Surgery can begin once the area of your peripheral nerve block is completely numb. The procedure may vary among health care providers and hospitals. How can I expect to feel after a peripheral nerve block? The area where the medicine is injected will be completely numb. You should not feel any pain during your procedure. The area of the peripheral nerve block may continue to feel numb after surgery. As the medicine wears off, feeling will gradually return  to the area. When you have a numb body part, you have a greater risk of injuring it because you have no sensation to tell you when something hurts. To reduce your risk of injury when you have a peripheral nerve block:  Be very careful when exposing numb body parts to heat or cold.  Do not lift heavy items.  Do not stand up or try to walk without help if you have a nerve block in one or both legs.  Seek medical care if:  You develop redness, swelling, or  pain around your injection site.  You have fluid or blood coming from your injection site.  Your injection site feels warm to the touch.  You have pus or a bad smell coming from your injection site.  You have pain near your injection site that gets worse.  You continue to have numbness, weakness, or tingling after your medicine has worn off. This information is not intended to replace advice given to you by your health care provider. Make sure you discuss any questions you have with your health care provider. Document Released: 10/23/2007 Document Revised: 06/12/2016 Document Reviewed: 03/22/2015 Elsevier Interactive Patient Education  2017 [employer].

## 2018-05-22 ENCOUNTER — Telehealth: Payer: Self-pay | Admitting: Neurology

## 2018-05-22 NOTE — Telephone Encounter (Signed)
MR Brain w/wo contrast & MR Cervical spine w/wo contrast Dr. Jaynee Eagles [facility] Auth: [id number] & [phone] (exp. 05/21/18 to 07/05/18). Patient is scheduled at [facility] for 06/03/18

## 2018-06-03 ENCOUNTER — Ambulatory Visit: Payer: 59

## 2018-06-03 DIAGNOSIS — R51 Headache: Secondary | ICD-10-CM

## 2018-06-03 DIAGNOSIS — M5481 Occipital neuralgia: Secondary | ICD-10-CM

## 2018-06-03 DIAGNOSIS — G8929 Other chronic pain: Secondary | ICD-10-CM

## 2018-06-03 DIAGNOSIS — R42 Dizziness and giddiness: Secondary | ICD-10-CM

## 2018-06-03 DIAGNOSIS — M542 Cervicalgia: Secondary | ICD-10-CM

## 2018-06-03 DIAGNOSIS — R29898 Other symptoms and signs involving the musculoskeletal system: Secondary | ICD-10-CM

## 2018-06-03 DIAGNOSIS — R200 Anesthesia of skin: Secondary | ICD-10-CM

## 2018-06-03 DIAGNOSIS — Z853 Personal history of malignant neoplasm of breast: Secondary | ICD-10-CM | POA: Diagnosis not present

## 2018-06-03 DIAGNOSIS — R519 Headache, unspecified: Secondary | ICD-10-CM

## 2018-06-03 MED ORDER — GADOBENATE DIMEGLUMINE 529 MG/ML IV SOLN
14.0000 mL | Freq: Once | INTRAVENOUS | Status: AC | PRN
  Administered 2018-06-03: 14 mL via INTRAVENOUS

## 2018-06-04 ENCOUNTER — Telehealth: Payer: Self-pay | Admitting: Neurology

## 2018-06-04 NOTE — Telephone Encounter (Signed)
I sent patient a mychart message as follow:  MRI of the brain and cervical spine were normal. I would complete physical therapy (as we discussed at appointment) and return to the office if the physical therapy is not successful with alleviating your symptoms. PT was ordered at last appoitment for you. Thanks, Dr Jaynee Eagles  If patient calls back please reiterate the above thanks

## 2018-06-05 ENCOUNTER — Ambulatory Visit: Payer: 59 | Admitting: Obstetrics & Gynecology

## 2018-06-05 ENCOUNTER — Encounter: Payer: Self-pay | Admitting: Obstetrics & Gynecology

## 2018-06-05 ENCOUNTER — Other Ambulatory Visit: Payer: Self-pay

## 2018-06-05 VITALS — BP 110/80 | HR 80 | Resp 16 | Ht 63.25 in | Wt 153.4 lb

## 2018-06-05 DIAGNOSIS — Z1211 Encounter for screening for malignant neoplasm of colon: Secondary | ICD-10-CM | POA: Diagnosis not present

## 2018-06-05 DIAGNOSIS — Z01419 Encounter for gynecological examination (general) (routine) without abnormal findings: Secondary | ICD-10-CM

## 2018-06-05 MED ORDER — NONFORMULARY OR COMPOUNDED ITEM: 4 refills | Status: DC

## 2018-06-05 NOTE — Progress Notes (Signed)
47 y.o. G21P1001 [name] here for annual exam.  Having some headache and occiptal neuralgia.  This is associated with nausea and dizziness at times.  Has seen cardiology as well.  Seeing Dr. Jaynee Hernandez.    Denies vaginal bleeding.  Is having vaginal dryness and tightness.  Has not been SA for >2 years.    Having some increased issues with fullness in right shoulder.  Feels this has changed in the last year.  Has good ROM.  Had frozen shoulder after her mastectomies.    Daughter is a Equities trader in high school.    Sister diagnosed with colon cancer last year that was metastatic.  She passed within a few months and died in 03-Jul-2017.  This is the fifth sister with a cancer (the other four were breast).   PCP:  Dr. Lynelle Hernandez.  Had blood work done earlier this year.  Has appt 11/14 for follow up.  Will see new provider then as Dr. Lynelle Hernandez is moving.  Patient's last menstrual period was 07/30/2005.          Sexually active: Yes.    The current method of family planning is status post hysterectomy.    Exercising: Yes.    walk Smoker:  no  Health Maintenance: Pap:  2009 normal  History of abnormal Pap:  no MMG:  2012 BIRADS1:Neg. Bilateral Mastectomy  Colonoscopy:  none BMD:   none TDaP:  2014 Screening Labs: PCP   reports that she has never smoked. She has never used smokeless tobacco. She reports that she drank alcohol. She reports that she does not use drugs.  Past Medical History:  Diagnosis Date  . Adenomyosis   . Allergy   . Arthritis    left hip  . Asthma    hospitalized frequently as child, now well controlled  . BRCA2 positive   . Carpal tunnel syndrome    left hand  . Carpal tunnel syndrome   . DVT (deep venous thrombosis) ([place]) 7/12   post op DVT  . ECZEMA, ATOPIC DERMATITIS 09/26/2006   Qualifier: Diagnosis of  By: Sylvia Hernandez    . GERD (gastroesophageal reflux disease)   . GERD (gastroesophageal reflux disease)   . H/O breast biopsy 4/11   fibrocystic changes  . Hearing loss   . Hyperlipidemia   . Lymphedema    right arm  . Meniere's disease   . Migraine   . Neuropathic pain   . PVC's (premature ventricular contractions)     Past Surgical History:  Procedure Laterality Date  . ABDOMINAL HYSTERECTOMY  02/12/06   Right Salpingectomy   . CESAREAN SECTION  03/20/01  . LAPAROSCOPY  12/11   BSO  . MASTECTOMY Bilateral 2012   prophylactic BRCA 2 positive  . OOPHORECTOMY Bilateral 2011   BRCA II postive  . SIMPLE MASTECTOMY  7/12   Bilateral  . tissue expander removed  01/14/14   Dr. Harlow Hernandez  . TYMPANOMASTOIDECTOMY  07/18/07    Current Outpatient Medications  Medication Sig Dispense Refill  . albuterol (VENTOLIN HFA) 108 (90 BASE) MCG/ACT inhaler Inhale 2 puffs into the lungs daily as needed. 1 Inhaler 0  . atorvastatin (LIPITOR) 10 MG tablet Take 1 tablet by mouth daily.    . indomethacin (INDOCIN) 50 MG capsule Take 50 mg by mouth 3 (three) times daily as needed.    . meclizine (ANTIVERT) 25 MG tablet Take 25 mg by mouth 3 (three) times daily as needed for dizziness.  No current facility-administered medications for this visit.     Family History  Problem Relation Age of Onset  . Prostate cancer Father   . Cancer Father        prostate, bone  . Cancer Sister 28       breast.  Died age 32.  . Hypothyroidism Sister   . Breast cancer Sister   . Cancer Sister 45       breast.  Died age 55.  . Hypothyroidism Sister   . Breast cancer Sister   . Thyroid disease Sister   . Cancer Sister 44       breast.  In remission  . Breast cancer Sister   . Cancer Sister 49       abdominal cancer diagnoed 2017  . Diabetes Brother   . Thyroid disease Brother   . Hypothyroidism Brother   . Hypertension Brother   . Breast cancer Other 32       sister's daugheter and paternal great aunts   . Breast cancer Sister        in remission    Review of Systems  All other systems reviewed and are negative.   Exam:   BP  110/80 (BP Location: Right Arm, Patient Position: Sitting, Cuff Size: Large)   Pulse 80   Resp 16   Ht 5' 3.25" (1.607 m)   Wt 153 lb 6.4 oz (69.6 kg)   LMP 07/30/2005   BMI 26.96 kg/m   Height: 5' 3.25" (160.7 cm)  Ht Readings from Last 3 Encounters:  06/05/18 5' 3.25" (1.607 m)  05/21/18 5' 2" (1.575 m)  06/04/16 5' 2.75" (1.594 m)    General appearance: alert, cooperative and appears stated age Head: Normocephalic, without obvious abnormality, atraumatic Neck: no adenopathy, supple, symmetrical, trachea midline and thyroid normal to inspection and palpation Lungs: clear to auscultation bilaterally Breasts: normal appearance, no masses or tenderness Heart: regular rate and rhythm Abdomen: soft, non-tender; bowel sounds normal; no masses,  no organomegaly Extremities: extremities normal, atraumatic, no cyanosis or edema Skin: Skin color, texture, turgor normal. No rashes or lesions Lymph nodes: Cervical, supraclavicular, and axillary nodes normal. No abnormal inguinal nodes palpated Neurologic: Grossly normal   Pelvic: External genitalia:  no lesions              Urethra:  normal appearing urethra with no masses, tenderness or lesions              Bartholins and Skenes: normal                 Vagina: normal appearing vagina with normal color and discharge, no lesions              Cervix: absent              Pap taken: No. Bimanual Exam:  Uterus:  uterus absent              Adnexa: no mass, fullness, tenderness               Rectovaginal: Confirms               Anus:  normal sphincter tone, no lesions  Chaperone was present for exam.  A:  Well Woman with normal exam PMP, no HRT H/o BRCA 2+ testing H/O bilateral mastectomies New family hx of colon cancer in sister who died from the disease  P:   Mammogram not indicated pap smear not indicated Referral to lymphedema clinic will   be made Trial of Vit E vaginal suppositories.  Rx will be sent to [facility]. Referral to  Dr. Collene Hernandez for screening colonoscopy. return annually or prn

## 2018-06-05 NOTE — Telephone Encounter (Signed)
noted 

## 2018-06-12 DIAGNOSIS — Z13220 Encounter for screening for lipoid disorders: Secondary | ICD-10-CM | POA: Diagnosis not present

## 2018-06-12 DIAGNOSIS — Z23 Encounter for immunization: Secondary | ICD-10-CM | POA: Diagnosis not present

## 2018-06-12 DIAGNOSIS — Z0000 Encounter for general adult medical examination without abnormal findings: Secondary | ICD-10-CM | POA: Diagnosis not present

## 2018-07-03 DIAGNOSIS — K625 Hemorrhage of anus and rectum: Secondary | ICD-10-CM | POA: Diagnosis not present

## 2018-07-03 DIAGNOSIS — Z800 Family history of malignant neoplasm of digestive organs: Secondary | ICD-10-CM | POA: Diagnosis not present

## 2018-07-03 DIAGNOSIS — K5900 Constipation, unspecified: Secondary | ICD-10-CM | POA: Diagnosis not present

## 2018-07-16 ENCOUNTER — Encounter: Payer: Self-pay | Admitting: Obstetrics & Gynecology

## 2018-07-16 DIAGNOSIS — Z1211 Encounter for screening for malignant neoplasm of colon: Secondary | ICD-10-CM | POA: Diagnosis not present

## 2018-07-16 DIAGNOSIS — Z800 Family history of malignant neoplasm of digestive organs: Secondary | ICD-10-CM | POA: Diagnosis not present

## 2021-11-14 ENCOUNTER — Ambulatory Visit: Payer: 59 | Admitting: Podiatry

## 2021-11-14 ENCOUNTER — Encounter: Payer: Self-pay | Admitting: Podiatry

## 2021-11-14 DIAGNOSIS — I493 Ventricular premature depolarization: Secondary | ICD-10-CM | POA: Insufficient documentation

## 2021-11-14 DIAGNOSIS — E663 Overweight: Secondary | ICD-10-CM | POA: Insufficient documentation

## 2021-11-14 DIAGNOSIS — L603 Nail dystrophy: Secondary | ICD-10-CM

## 2021-11-14 DIAGNOSIS — I890 Lymphedema, not elsewhere classified: Secondary | ICD-10-CM | POA: Insufficient documentation

## 2021-11-14 DIAGNOSIS — R7303 Prediabetes: Secondary | ICD-10-CM | POA: Insufficient documentation

## 2021-11-14 DIAGNOSIS — Z8709 Personal history of other diseases of the respiratory system: Secondary | ICD-10-CM | POA: Insufficient documentation

## 2021-11-14 DIAGNOSIS — E039 Hypothyroidism, unspecified: Secondary | ICD-10-CM | POA: Insufficient documentation

## 2021-11-14 DIAGNOSIS — M792 Neuralgia and neuritis, unspecified: Secondary | ICD-10-CM | POA: Insufficient documentation

## 2021-11-14 DIAGNOSIS — E559 Vitamin D deficiency, unspecified: Secondary | ICD-10-CM | POA: Insufficient documentation

## 2021-11-14 DIAGNOSIS — M654 Radial styloid tenosynovitis [de Quervain]: Secondary | ICD-10-CM | POA: Insufficient documentation

## 2021-11-14 NOTE — Progress Notes (Signed)
?Subjective:  ?Patient ID: Sylvia Hernandez, female    DOB: [birth year],  MRN: [id number] ?HPI ?Chief Complaint  ?Patient presents with  ? Nail Problem  ?  Hallux left - toenail thick and discolored x years, been removed 3 times-the last removal was supposed to be permanent  ? New Patient (Initial Visit)  ? ? ?51 y.o. female presents with the above complaint.  ? ?ROS: Denies fever chills nausea vomiting muscle aches pains calf pain back pain chest pain shortness of breath. ? ?Past Medical History:  ?Diagnosis Date  ? Adenomyosis   ? Allergy   ? Arthritis   ? left hip  ? Asthma   ? hospitalized frequently as child, now well controlled  ? BRCA2 positive   ? Carpal tunnel syndrome   ? left hand  ? DVT (deep venous thrombosis) ([place]) 7/12  ? post op DVT  ? ECZEMA, ATOPIC DERMATITIS 09/26/2006  ? Qualifier: Diagnosis of  By: Beryle Lathe    ? GERD (gastroesophageal reflux disease)   ? H/O breast biopsy 4/11  ? fibrocystic changes  ? Hearing loss   ? Hyperlipidemia   ? Lymphedema   ? right arm  ? Meniere's disease   ? Migraine   ? Neuropathic pain   ? PVC's (premature ventricular contractions)   ? ?Past Surgical History:  ?Procedure Laterality Date  ? ABDOMINAL HYSTERECTOMY  02/12/06  ? Right Salpingectomy   ? CESAREAN SECTION  03/20/01  ? LAPAROSCOPY  12/11  ? BSO  ? MASTECTOMY Bilateral 2012  ? prophylactic BRCA 2 positive  ? OOPHORECTOMY Bilateral 2011  ? BRCA II postive  ? SIMPLE MASTECTOMY  7/12  ? Bilateral  ? tissue expander removed  01/14/14  ? Dr. Harlow Mares  ? TYMPANOMASTOIDECTOMY  07/18/07  ? ? ?Current Outpatient Medications:  ?  albuterol (VENTOLIN HFA) 108 (90 BASE) MCG/ACT inhaler, Inhale 2 puffs into the lungs daily as needed., Disp: 1 Inhaler, Rfl: 0 ?  atorvastatin (LIPITOR) 20 MG tablet, Take 20 mg by mouth daily., Disp: , Rfl:  ?  levothyroxine (SYNTHROID) 100 MCG tablet, Take 100 mcg by mouth every morning., Disp: , Rfl:  ?  Vitamin D, Ergocalciferol, (DRISDOL) 1.25 MG (50000 UNIT) CAPS capsule, Take 50,000  Units by mouth once a week., Disp: , Rfl:  ? ?Allergies  ?Allergen Reactions  ? Betadine [Povidone Iodine] Swelling  ? Codeine Anaphylaxis, Shortness Of Breath and Other (See Comments)  ?  Throat will also feel scratchy, and palpitations.  ? Ibuprofen Palpitations  ? Shellfish Allergy Palpitations and Other (See Comments)  ?  Swelling of the mouth  ? ?Review of Systems ?Objective:  ?There were no vitals filed for this visit. ? ?General: Well developed, nourished, in no acute distress, alert and oriented x3  ? ?Dermatological: Skin is warm, dry and supple bilateral. Nails x 10 are well maintained; remaining integument appears unremarkable at this time. There are no open sores, no preulcerative lesions, no rash or signs of infection present.  Hallux left demonstrates a thick yellow dystrophic with mycotic nail subungual debris most likely this has nail dystrophy as well since it has been removed multiple times in the past. ? ?Vascular: Dorsalis Pedis artery and Posterior Tibial artery pedal pulses are 2/4 bilateral with immedate capillary fill time. Pedal hair growth present. No varicosities and no lower extremity edema present bilateral.  ? ?Neruologic: Grossly intact via light touch bilateral. Vibratory intact via tuning fork bilateral. Protective threshold with Semmes Wienstein monofilament intact to all pedal  sites bilateral. Patellar and Achilles deep tendon reflexes 2+ bilateral. No Babinski or clonus noted bilateral.  ? ?Musculoskeletal: No gross boney pedal deformities bilateral. No pain, crepitus, or limitation noted with foot and ankle range of motion bilateral. Muscular strength 5/5 in all groups tested bilateral. ? ?Gait: Unassisted, Nonantalgic.  ? ? ?Radiographs: ? ?None taken ? ?Assessment & Plan:  ? ?Assessment: Nail dystrophy hallux left no signs of tinea pedis ? ?Plan: Discussed etiology pathology conservative versus surgical therapies.  Initially she had thought about this removing the nail with a  matrixectomy.  However she did not want to do it today but wanted to know if I can trim the nail.  After the nail was debrided I recommended that we send those pieces for sample to the lab for PCR test.  I will also follow-up with her on a day if she can have a matrixectomy done should this nail pathology come back just so a nail dystrophy. ? ? ? ? ?Max T. Pindall, DPM ?

## 2021-12-14 ENCOUNTER — Ambulatory Visit: Payer: 59 | Admitting: Podiatry

## 2022-01-11 ENCOUNTER — Ambulatory Visit: Payer: 59 | Admitting: Podiatry

## 2022-02-14 ENCOUNTER — Encounter: Payer: Self-pay | Admitting: *Deleted

## 2022-02-14 MED ORDER — NEOMYCIN-POLYMYXIN-HC 1 % OT SOLN: OTIC | 6 refills | Status: DC

## 2022-02-14 NOTE — Progress Notes (Signed)
Patient notified of negative fungal culture results and prescription for bacterial colonization has been sent into the pharmacy. She was also informed she may cancel her appointment as well.

## 2022-02-15 ENCOUNTER — Ambulatory Visit (INDEPENDENT_AMBULATORY_CARE_PROVIDER_SITE_OTHER): Payer: 59 | Admitting: Podiatry

## 2022-02-15 DIAGNOSIS — L603 Nail dystrophy: Secondary | ICD-10-CM

## 2022-02-15 NOTE — Progress Notes (Signed)
Patient presents to the office today for her appointment. She didn't receive the message to cancel yesterday.  I discussed the negative fungal culture results with her and informed her it did show bacterial colonization. I sent in a topical  prescription to her pharmacy yesterday to use on her toenail for the next 3 months.   She was concerned about the thickness of the nail and how the topical could get around it. I offered to smooth the nail down, which she agreed to.   Hallux nail left filed thin and patient was satisfied with result.   She would like to try the topical medication over the next 3 months to see if nail improves, if not she may consider removing the nail in total, permanently.   Follow up in 3 months with Dr. Milinda Pointer for re-evaluation and possible total matrixectomy of the hallux left.

## 2022-06-05 ENCOUNTER — Ambulatory Visit: Payer: 59 | Admitting: Podiatry

## 2022-06-05 ENCOUNTER — Encounter: Payer: Self-pay | Admitting: Podiatry

## 2022-06-05 DIAGNOSIS — L603 Nail dystrophy: Secondary | ICD-10-CM | POA: Diagnosis not present

## 2022-06-05 DIAGNOSIS — K625 Hemorrhage of anus and rectum: Secondary | ICD-10-CM | POA: Insufficient documentation

## 2022-06-05 DIAGNOSIS — K5900 Constipation, unspecified: Secondary | ICD-10-CM | POA: Insufficient documentation

## 2022-06-05 MED ORDER — NEOMYCIN-POLYMYXIN-HC 1 % OT SOLN: OTIC | 1 refills | Status: DC

## 2022-06-05 NOTE — Progress Notes (Signed)
She presents today for follow-up of her subungual bacteria she has been putting her Cortisporin Otic on her nail and has not noticed any difference.  States that seems to actually be raising up becoming more problematic.  Objective: Vital signs stable she is alert and oriented x3 nail dystrophy with distal onycholysis hallux left.  Assessment painful dystrophic nail.  Plan: Total nail avulsion with chemical matrixectomy hallux left.  Tolerated procedure well after local anesthetic was administered we will provide both oral and home-going structure for care and soaking of the toe as well as prescription for Cortisporin Otic to be applied twice daily.  Follow-up with her in about 2 to 3 weeks

## 2022-06-05 NOTE — Patient Instructions (Signed)

## 2022-06-26 ENCOUNTER — Encounter: Payer: Self-pay | Admitting: Podiatry

## 2022-06-26 ENCOUNTER — Ambulatory Visit: Payer: 59 | Admitting: Podiatry

## 2022-06-26 DIAGNOSIS — L603 Nail dystrophy: Secondary | ICD-10-CM

## 2022-06-26 DIAGNOSIS — Z9889 Other specified postprocedural states: Secondary | ICD-10-CM

## 2022-06-26 NOTE — Progress Notes (Signed)
She presents today for follow-up of her nail matrixectomy hallux left.  She states that it throbs a few days after the procedure but immediately following procedure and for 2 to 3 days there is no pain whatsoever she states this note is just a little bit of draining but really all in all is doing very good she states that the worst part is the itching and she continues to soak in Betadine.  Objective: Vital signs are stable alert oriented x 3.  Pulses are palpable.  There is no erythema edema cellulitis drainage or odor she does have some dermatitis around the hallux associated most likely with sustained Betadine skin.  I see no signs of infection at all.  Assessment: Well-healing nail avulsion with matrixectomy hallux left contact dermatitis associated with the Betadine most likely.  Plan: Recommended that she started soaking in Epsom salts and warm water once a day or every other day.  She will continue her Cortisporin otic drops until they are gone covered in the daytime but leave open at bedtime.  Follow-up with me in a couple weeks if not completely resolved.

## 2022-09-24 ENCOUNTER — Encounter: Payer: Self-pay | Admitting: Podiatry

## 2022-09-24 ENCOUNTER — Ambulatory Visit: Payer: 59 | Admitting: Podiatry

## 2022-09-24 DIAGNOSIS — L603 Nail dystrophy: Secondary | ICD-10-CM | POA: Diagnosis not present

## 2022-09-24 MED ORDER — DOXYCYCLINE HYCLATE 100 MG PO TABS: 100.0000 mg | ORAL_TABLET | Freq: Two times a day (BID) | ORAL | 0 refills | Status: AC

## 2022-09-24 MED ORDER — NEOMYCIN-POLYMYXIN-HC 1 % OT SOLN: OTIC | 1 refills | Status: AC

## 2022-09-24 NOTE — Progress Notes (Signed)
Chief Complaint  Patient presents with   Toe Pain    Hallux left - had total nail removal 06/05/22, now for the past week or so its been draining, kept covered, tried drops given after initial procedure and soaking, also noticed its been itching some    HPI: 52 y.o. female presenting today for follow-up evaluation regarding total permanent nail avulsion to the left hallux nail plate.  This was performed here in the office on 06/05/2022.  Patient states that she was doing well however over the past 10 days she has noticed drainage coming from the area with itching.  She presents for further treatment and evaluation  Past Medical History:  Diagnosis Date   Adenomyosis    Allergy    Arthritis    left hip   Asthma    hospitalized frequently as child, now well controlled   BRCA2 positive    Carpal tunnel syndrome    left hand   DVT (deep venous thrombosis) ([place]) 7/12   post op DVT   ECZEMA, ATOPIC DERMATITIS 09/26/2006   Qualifier: Diagnosis of  By: Beryle Lathe     GERD (gastroesophageal reflux disease)    H/O breast biopsy 4/11   fibrocystic changes   Hearing loss    Hyperlipidemia    Lymphedema    right arm   Meniere's disease    Migraine    Neuropathic pain    PVC's (premature ventricular contractions)     Past Surgical History:  Procedure Laterality Date   ABDOMINAL HYSTERECTOMY  02/12/06   Right Salpingectomy    CESAREAN SECTION  03/20/01   LAPAROSCOPY  12/11   BSO   MASTECTOMY Bilateral 2012   prophylactic BRCA 2 positive   OOPHORECTOMY Bilateral 2011   BRCA II postive   SIMPLE MASTECTOMY  7/12   Bilateral   tissue expander removed  01/14/14   Dr. Harlow Mares   TYMPANOMASTOIDECTOMY  07/18/07    Allergies  Allergen Reactions   Betadine [Povidone Iodine] Swelling   Codeine Anaphylaxis, Shortness Of Breath and Other (See Comments)    Throat will also feel scratchy, and palpitations.   Ibuprofen Palpitations and Other (See Comments)   Shellfish Allergy  Palpitations and Other (See Comments)    Swelling of the mouth     LT great toe 09/24/2022  Physical Exam: General: The patient is alert and oriented x3 in no acute distress.  Dermatology: After debridement of the left hallux nail bed there is some underlying granular tissue that is not resolved or healed.  No malodor or purulence.  Granular wound base.  There is no exposed bone muscle tendon ligament or joint  Vascular: Palpable pedal pulses bilaterally. Capillary refill within normal limits.  Negative for any significant edema or erythema  Neurological: Light touch and protective threshold grossly intact  Musculoskeletal Exam: No pedal deformities noted  Assessment: 1. S/p total permanent nail avulsion left hallux nail plate 06/05/2022 -Patient evaluated.  Light debridement of the area was performed today using a tissue nipper. -Although clinically this does not look like a true infection or underlying abscess/cellulitis I did call in a prescription for doxycycline 100 mg 2 times daily #20 -Resume the topical antibiotic drops.  Refill provided -Resume soaking the foot 2 times daily  -Return to clinic 3 weeks      Edrick Kins, DPM [facility]  Dr. Edrick Kins, DPM    [address]  [place], [place] [address]                Office [phone]  Fax [phone]

## 2022-10-16 ENCOUNTER — Ambulatory Visit: Payer: 59 | Admitting: Podiatry

## 2023-08-16 ENCOUNTER — Encounter: Payer: Self-pay | Admitting: Gastroenterology

## 2023-08-20 ENCOUNTER — Other Ambulatory Visit: Payer: Self-pay | Admitting: Gastroenterology

## 2023-08-20 DIAGNOSIS — R7401 Elevation of levels of liver transaminase levels: Secondary | ICD-10-CM

## 2023-08-22 ENCOUNTER — Ambulatory Visit
Admission: RE | Admit: 2023-08-22 | Discharge: 2023-08-22 | Disposition: A | Discharge status: Home / Self Care | Payer: 59 | Source: Ambulatory Visit | Attending: Gastroenterology | Admitting: Gastroenterology

## 2023-08-22 DIAGNOSIS — R7401 Elevation of levels of liver transaminase levels: Secondary | ICD-10-CM
